# Patient Record
Sex: Female | Born: 1935
Health system: Southern US, Community
[De-identification: ages and names within clinical notes are randomized; demographics above are authoritative.]

## PROBLEM LIST (undated history)

## (undated) DIAGNOSIS — E7800 Pure hypercholesterolemia, unspecified: Secondary | ICD-10-CM

## (undated) DIAGNOSIS — G43909 Migraine, unspecified, not intractable, without status migrainosus: Secondary | ICD-10-CM

## (undated) DIAGNOSIS — N393 Stress incontinence (female) (male): Secondary | ICD-10-CM

## (undated) DIAGNOSIS — I10 Essential (primary) hypertension: Secondary | ICD-10-CM

## (undated) DIAGNOSIS — R519 Headache, unspecified: Secondary | ICD-10-CM

## (undated) DIAGNOSIS — C50919 Malignant neoplasm of unspecified site of unspecified female breast: Secondary | ICD-10-CM

## (undated) DIAGNOSIS — M1990 Unspecified osteoarthritis, unspecified site: Secondary | ICD-10-CM

## (undated) DIAGNOSIS — M5430 Sciatica, unspecified side: Secondary | ICD-10-CM

## (undated) DIAGNOSIS — Z6826 Body mass index (BMI) 26.0-26.9, adult: Secondary | ICD-10-CM

## (undated) DIAGNOSIS — J939 Pneumothorax, unspecified: Secondary | ICD-10-CM

## (undated) DIAGNOSIS — K219 Gastro-esophageal reflux disease without esophagitis: Secondary | ICD-10-CM

## (undated) DIAGNOSIS — R51 Headache: Secondary | ICD-10-CM

## (undated) DIAGNOSIS — C801 Malignant (primary) neoplasm, unspecified: Secondary | ICD-10-CM

## (undated) DIAGNOSIS — M51369 Other intervertebral disc degeneration, lumbar region without mention of lumbar back pain or lower extremity pain: Secondary | ICD-10-CM

## (undated) DIAGNOSIS — I4891 Unspecified atrial fibrillation: Secondary | ICD-10-CM

## (undated) DIAGNOSIS — M5136 Other intervertebral disc degeneration, lumbar region: Secondary | ICD-10-CM

## (undated) HISTORY — PX: CATARACT EXTRACTION: SUR2

## (undated) HISTORY — PX: BIOPSY THYROID: PRO38

## (undated) HISTORY — PX: DENTAL SURGERY: SHX609

## (undated) HISTORY — DX: Unspecified atrial fibrillation: I48.91

## (undated) HISTORY — PX: BACK SURGERY: SHX140

## (undated) HISTORY — PX: OTHER SURGICAL HISTORY: SHX169

## (undated) HISTORY — DX: Body mass index (BMI) 26.0-26.9, adult: Z68.26

## (undated) HISTORY — PX: LUNG SURGERY: SHX703

---

## 1974-04-26 HISTORY — PX: VIDEO ASSISTED THORACOSCOPY (VATS) W/TALC PLEUADESIS: SHX6168

## 1980-04-26 HISTORY — PX: ABDOMINAL HYSTERECTOMY: SHX81

## 1997-10-31 ENCOUNTER — Other Ambulatory Visit: Admission: RE | Admit: 1997-10-31 | Discharge: 1997-10-31 | Payer: Self-pay | Admitting: *Deleted

## 1998-04-26 HISTORY — PX: BACK SURGERY: SHX140

## 1999-07-17 ENCOUNTER — Other Ambulatory Visit: Admission: RE | Admit: 1999-07-17 | Discharge: 1999-07-17 | Payer: Self-pay

## 2005-04-26 HISTORY — PX: EYE SURGERY: SHX253

## 2010-04-26 DIAGNOSIS — C801 Malignant (primary) neoplasm, unspecified: Secondary | ICD-10-CM

## 2010-04-26 HISTORY — PX: BREAST SURGERY: SHX581

## 2010-04-26 HISTORY — DX: Malignant (primary) neoplasm, unspecified: C80.1

## 2012-02-07 ENCOUNTER — Emergency Department (HOSPITAL_BASED_OUTPATIENT_CLINIC_OR_DEPARTMENT_OTHER)
Admission: EM | Admit: 2012-02-07 | Discharge: 2012-02-07 | Disposition: A | Discharge status: Home / Self Care | Payer: Medicare Other | Attending: Emergency Medicine | Admitting: Emergency Medicine

## 2012-02-07 ENCOUNTER — Encounter (HOSPITAL_BASED_OUTPATIENT_CLINIC_OR_DEPARTMENT_OTHER): Payer: Self-pay | Admitting: *Deleted

## 2012-02-07 DIAGNOSIS — M5137 Other intervertebral disc degeneration, lumbosacral region: Secondary | ICD-10-CM | POA: Insufficient documentation

## 2012-02-07 DIAGNOSIS — G44209 Tension-type headache, unspecified, not intractable: Secondary | ICD-10-CM | POA: Insufficient documentation

## 2012-02-07 DIAGNOSIS — M542 Cervicalgia: Secondary | ICD-10-CM | POA: Insufficient documentation

## 2012-02-07 DIAGNOSIS — M51379 Other intervertebral disc degeneration, lumbosacral region without mention of lumbar back pain or lower extremity pain: Secondary | ICD-10-CM | POA: Insufficient documentation

## 2012-02-07 DIAGNOSIS — Z79899 Other long term (current) drug therapy: Secondary | ICD-10-CM | POA: Insufficient documentation

## 2012-02-07 DIAGNOSIS — Z853 Personal history of malignant neoplasm of breast: Secondary | ICD-10-CM | POA: Insufficient documentation

## 2012-02-07 DIAGNOSIS — Z7982 Long term (current) use of aspirin: Secondary | ICD-10-CM | POA: Insufficient documentation

## 2012-02-07 HISTORY — DX: Malignant neoplasm of unspecified site of unspecified female breast: C50.919

## 2012-02-07 HISTORY — DX: Pure hypercholesterolemia, unspecified: E78.00

## 2012-02-07 HISTORY — DX: Other intervertebral disc degeneration, lumbar region: M51.36

## 2012-02-07 HISTORY — DX: Migraine, unspecified, not intractable, without status migrainosus: G43.909

## 2012-02-07 HISTORY — DX: Other intervertebral disc degeneration, lumbar region without mention of lumbar back pain or lower extremity pain: M51.369

## 2012-02-07 HISTORY — DX: Pneumothorax, unspecified: J93.9

## 2012-02-07 HISTORY — DX: Sciatica, unspecified side: M54.30

## 2012-02-07 MED ORDER — SODIUM CHLORIDE 0.9 % IV BOLUS (SEPSIS)
1000.0000 mL | Freq: Once | INTRAVENOUS | Status: AC
  Administered 2012-02-07: 1000 mL via INTRAVENOUS

## 2012-02-07 MED ORDER — DIPHENHYDRAMINE HCL 50 MG/ML IJ SOLN
25.0000 mg | INTRAMUSCULAR | Status: AC
  Administered 2012-02-07: 08:00:00 via INTRAVENOUS
  Filled 2012-02-07: qty 1

## 2012-02-07 MED ORDER — HYDROMORPHONE HCL PF 1 MG/ML IJ SOLN: 1.0000 mg | Freq: Once | INTRAMUSCULAR | Status: DC

## 2012-02-07 MED ORDER — HYDROMORPHONE HCL PF 1 MG/ML IJ SOLN
1.0000 mg | Freq: Once | INTRAMUSCULAR | Status: AC
  Administered 2012-02-07: 1 mg via INTRAVENOUS
  Filled 2012-02-07: qty 1

## 2012-02-07 MED ORDER — METOCLOPRAMIDE HCL 5 MG/ML IJ SOLN
10.0000 mg | Freq: Once | INTRAMUSCULAR | Status: AC
  Administered 2012-02-07: 10 mg via INTRAVENOUS
  Filled 2012-02-07: qty 2

## 2012-02-07 MED ORDER — OXYCODONE-ACETAMINOPHEN 5-325 MG PO TABS: 1.0000 | ORAL_TABLET | ORAL | Status: DC | PRN

## 2012-02-07 MED ORDER — KETOROLAC TROMETHAMINE 30 MG/ML IJ SOLN
30.0000 mg | Freq: Once | INTRAMUSCULAR | Status: AC
  Administered 2012-02-07: 30 mg via INTRAVENOUS
  Filled 2012-02-07: qty 1

## 2012-02-07 NOTE — ED Notes (Signed)
IV BAG HAS GONE DOWN-PER DAUGHTER

## 2012-02-07 NOTE — ED Notes (Signed)
Patient states she developed a migraine last night at 2200.  Unsure if she took any of her medications or not.  Hx of same.

## 2012-02-07 NOTE — ED Provider Notes (Signed)
History     CSN: [id number]  Arrival date & time 02/07/12  0719   First MD Initiated Contact with Patient 02/07/12 [phone]      Chief Complaint  Patient presents with  . Migraine    (Consider location/radiation/quality/duration/timing/severity/associated sxs/prior treatment) Patient is a 76 y.o. female presenting with migraines. The history is provided by the patient.  Migraine  She had onset at about 10 PM last night of a severe headache in the neck and lower occipital area. Headache is throbbing and pounding and she rates the pain at 10/10. It is slightly worse with light and noise. Nothing makes it any better. There is no associated blurring of vision and only minimal nausea. She has not vomited. She denies weakness, numbness, tingling. She has a history of migraines, but this is different from her typical migraines and that her migraines are usually located in the frontal region. She had a similar headache 2 months ago and was seen at [facility] where CT scan was negative. She got some kind of headache cocktail which did not give her relief and she ended up getting Dilaudid and Zofran which didn't give relief.  Past Medical History  Diagnosis Date  . Elevated cholesterol   . Pneumothorax     congential  defect x 3 surgery corrected in 1976  . Migraine   . Breast cancer   . DDD (degenerative disc disease), lumbar   . Sciatic pain     Past Surgical History  Procedure Date  . Mastectomy     right  . Lung surgery     1976  . Back surgery   . Cataract extraction     bilateral  . Dental surgery   . Plantar fascitis   . Biopsy thyroid     No family history on file.  History  Substance Use Topics  . Smoking status: Never Smoker   . Smokeless tobacco: Not on file  . Alcohol Use: No    OB History    Grav Para Term Preterm Abortions TAB SAB Ect Mult Living                  Review of Systems  All other systems reviewed and are  negative.    Allergies  Morphine and related  Home Medications   Current Outpatient Rx  Name Route Sig Dispense Refill  . VITAMIN C 100 MG PO TABS Oral Take 100 mg by mouth daily.    Marland Kitchen VITAMIN C 1000 MG PO TABS Oral Take 1,000 mg by mouth daily.    . ASPIRIN 81 MG PO TABS Oral Take 81 mg by mouth daily.    Marland Kitchen CINNAMON 500 MG PO CAPS Oral Take 500 mg by mouth daily.    Marland Kitchen CLONAZEPAM 0.5 MG PO TABS Oral Take 0.5 mg by mouth 2 (two) times daily as needed.    Marland Kitchen DHEA 25 MG PO CAPS Oral Take by mouth.    . ETODOLAC 400 MG PO TABS Oral Take 400 mg by mouth 2 (two) times daily.    . IBUPROFEN 600 MG PO TABS Oral Take 600 mg by mouth every 6 (six) hours as needed.    Marland Kitchen LETROZOLE 2.5 MG PO TABS Oral Take 2.5 mg by mouth daily.    Marland Kitchen MAGNESIUM GLUCONATE 500 MG PO TABS Oral Take 250 mg by mouth 2 (two) times daily.    . MELOXICAM 15 MG PO TABS Oral Take 15 mg by mouth daily.    Marland Kitchen  NORTRIPTYLINE HCL 10 MG PO CAPS Oral Take 10 mg by mouth at bedtime.    Marland Kitchen RIZATRIPTAN BENZOATE 10 MG PO TABS Oral Take 10 mg by mouth as needed. May repeat in 2 hours if needed    . VENLAFAXINE HCL 100 MG PO TABS Oral Take 100 mg by mouth 2 (two) times daily.    Marland Kitchen VITAMIN B-12 500 MCG PO TABS Oral Take 500 mcg by mouth daily.    Marland Kitchen ZINC GLUCONATE 50 MG PO TABS Oral Take 50 mg by mouth daily.      BP 97/56  Pulse 113  Temp 97.4 F (36.3 C)  Resp 20  Ht 5\' 8"  (1.727 m)  Wt 217 lb (98.431 kg)  BMI 32.99 kg/m2  SpO2 100%  Physical Exam  Nursing note and vitals reviewed.  76 year old female who appears uncomfortable and in pain. Vital signs are significant for tachycardia with heart rate of 113. Oxygen saturation is 100%, which is normal. Head is normocephalic and atraumatic. PERRLA, EOMI. Oropharynx is clear. Fundi show no hemorrhage, exudate, or papilledema. Neck is supple without adenopathy or JVD. There is moderate spasm of the paracervical muscles with tenderness of the paracervical muscles. Back is nontender  and there is no CVA tenderness. Lungs are clear without rales, wheezes, or rhonchi. Chest is nontender. Heart has regular rate and rhythm without murmur. Abdomen is soft, flat, nontender without masses or hepatosplenomegaly and peristalsis is normoactive. Extremities have no cyanosis or edema, full range of motion is present. Skin is warm and dry without rash. Neurologic: Mental status is normal, cranial nerves are intact, there are no motor or sensory deficits.  ED Course  Procedures (including critical care time)   1. Muscle contraction headache       MDM  Headache which seems most compatible with muscle contraction headache. She will be given a  Headache cocktail and reassessed. Prior ED records show no visits for headaches.  1478: She got moderate relief with metoclopramide, diphenhydramine, ketorolac. She's given an injection of hydromorphone.  0945: She feels much better. Pain is down to 3/10 and she wishes to go home. She is sent home with prescription for Percocet for pain, and referred to neurology for further workup.    Dione Booze, MD 02/07/12 [phone]

## 2013-04-02 ENCOUNTER — Encounter (INDEPENDENT_AMBULATORY_CARE_PROVIDER_SITE_OTHER): Payer: Medicare Other | Admitting: Ophthalmology

## 2013-04-02 DIAGNOSIS — H3530 Unspecified macular degeneration: Secondary | ICD-10-CM

## 2013-04-02 DIAGNOSIS — H43819 Vitreous degeneration, unspecified eye: Secondary | ICD-10-CM

## 2013-05-11 ENCOUNTER — Encounter (INDEPENDENT_AMBULATORY_CARE_PROVIDER_SITE_OTHER): Payer: Medicare Other | Admitting: Ophthalmology

## 2013-05-11 DIAGNOSIS — H3530 Unspecified macular degeneration: Secondary | ICD-10-CM

## 2013-05-11 DIAGNOSIS — H43819 Vitreous degeneration, unspecified eye: Secondary | ICD-10-CM

## 2013-05-14 ENCOUNTER — Encounter (INDEPENDENT_AMBULATORY_CARE_PROVIDER_SITE_OTHER): Payer: Medicare Other | Admitting: Ophthalmology

## 2013-12-08 DIAGNOSIS — N951 Menopausal and female climacteric states: Secondary | ICD-10-CM

## 2013-12-08 DIAGNOSIS — M810 Age-related osteoporosis without current pathological fracture: Secondary | ICD-10-CM | POA: Insufficient documentation

## 2013-12-08 DIAGNOSIS — R110 Nausea: Secondary | ICD-10-CM

## 2013-12-08 DIAGNOSIS — N393 Stress incontinence (female) (male): Secondary | ICD-10-CM

## 2013-12-08 DIAGNOSIS — M25559 Pain in unspecified hip: Secondary | ICD-10-CM

## 2013-12-08 DIAGNOSIS — M25539 Pain in unspecified wrist: Secondary | ICD-10-CM

## 2013-12-08 DIAGNOSIS — M7989 Other specified soft tissue disorders: Secondary | ICD-10-CM | POA: Insufficient documentation

## 2013-12-08 DIAGNOSIS — N63 Unspecified lump in unspecified breast: Secondary | ICD-10-CM

## 2013-12-08 DIAGNOSIS — M719 Bursopathy, unspecified: Secondary | ICD-10-CM | POA: Insufficient documentation

## 2013-12-08 DIAGNOSIS — R7301 Impaired fasting glucose: Secondary | ICD-10-CM

## 2013-12-08 DIAGNOSIS — R030 Elevated blood-pressure reading, without diagnosis of hypertension: Secondary | ICD-10-CM

## 2013-12-08 DIAGNOSIS — G43909 Migraine, unspecified, not intractable, without status migrainosus: Secondary | ICD-10-CM

## 2013-12-08 DIAGNOSIS — R42 Dizziness and giddiness: Secondary | ICD-10-CM

## 2013-12-08 DIAGNOSIS — M545 Low back pain, unspecified: Secondary | ICD-10-CM

## 2013-12-08 DIAGNOSIS — R58 Hemorrhage, not elsewhere classified: Secondary | ICD-10-CM

## 2013-12-08 DIAGNOSIS — M7730 Calcaneal spur, unspecified foot: Secondary | ICD-10-CM | POA: Insufficient documentation

## 2013-12-08 DIAGNOSIS — E669 Obesity, unspecified: Secondary | ICD-10-CM

## 2013-12-08 DIAGNOSIS — G479 Sleep disorder, unspecified: Secondary | ICD-10-CM

## 2013-12-08 DIAGNOSIS — C50919 Malignant neoplasm of unspecified site of unspecified female breast: Secondary | ICD-10-CM

## 2013-12-08 DIAGNOSIS — K219 Gastro-esophageal reflux disease without esophagitis: Secondary | ICD-10-CM

## 2013-12-08 DIAGNOSIS — E042 Nontoxic multinodular goiter: Secondary | ICD-10-CM

## 2013-12-08 DIAGNOSIS — E785 Hyperlipidemia, unspecified: Secondary | ICD-10-CM

## 2013-12-08 DIAGNOSIS — F32A Depression, unspecified: Secondary | ICD-10-CM

## 2013-12-08 DIAGNOSIS — E559 Vitamin D deficiency, unspecified: Secondary | ICD-10-CM

## 2013-12-08 DIAGNOSIS — J309 Allergic rhinitis, unspecified: Secondary | ICD-10-CM

## 2013-12-08 DIAGNOSIS — S40019A Contusion of unspecified shoulder, initial encounter: Secondary | ICD-10-CM | POA: Insufficient documentation

## 2013-12-08 DIAGNOSIS — N3642 Intrinsic sphincter deficiency (ISD): Secondary | ICD-10-CM

## 2013-12-08 DIAGNOSIS — I10 Essential (primary) hypertension: Secondary | ICD-10-CM

## 2013-12-08 DIAGNOSIS — R519 Headache, unspecified: Secondary | ICD-10-CM | POA: Insufficient documentation

## 2013-12-08 DIAGNOSIS — S52509A Unspecified fracture of the lower end of unspecified radius, initial encounter for closed fracture: Secondary | ICD-10-CM

## 2013-12-08 DIAGNOSIS — M1990 Unspecified osteoarthritis, unspecified site: Secondary | ICD-10-CM

## 2013-12-08 DIAGNOSIS — K589 Irritable bowel syndrome without diarrhea: Secondary | ICD-10-CM | POA: Insufficient documentation

## 2013-12-08 DIAGNOSIS — M5414 Radiculopathy, thoracic region: Secondary | ICD-10-CM

## 2013-12-08 DIAGNOSIS — H9190 Unspecified hearing loss, unspecified ear: Secondary | ICD-10-CM

## 2013-12-08 HISTORY — DX: Unspecified osteoarthritis, unspecified site: M19.90

## 2013-12-08 HISTORY — DX: Depression, unspecified: F32.A

## 2013-12-08 HISTORY — DX: Sleep disorder, unspecified: G47.9

## 2013-12-08 HISTORY — DX: Unspecified fracture of the lower end of unspecified radius, initial encounter for closed fracture: S52.509A

## 2013-12-08 HISTORY — DX: Essential (primary) hypertension: I10

## 2013-12-08 HISTORY — DX: Irritable bowel syndrome, unspecified: K58.9

## 2013-12-08 HISTORY — DX: Hemorrhage, not elsewhere classified: R58

## 2013-12-08 HISTORY — DX: Other specified soft tissue disorders: M79.89

## 2013-12-08 HISTORY — DX: Unspecified lump in unspecified breast: N63.0

## 2013-12-08 HISTORY — DX: Calcaneal spur, unspecified foot: M77.30

## 2013-12-08 HISTORY — DX: Nontoxic multinodular goiter: E04.2

## 2013-12-08 HISTORY — DX: Nausea: R11.0

## 2013-12-08 HISTORY — DX: Age-related osteoporosis without current pathological fracture: M81.0

## 2013-12-08 HISTORY — DX: Allergic rhinitis, unspecified: J30.9

## 2013-12-08 HISTORY — DX: Vitamin D deficiency, unspecified: E55.9

## 2013-12-08 HISTORY — DX: Hypercalcemia: E83.52

## 2013-12-08 HISTORY — DX: Pain in unspecified hip: M25.559

## 2013-12-08 HISTORY — DX: Stress incontinence (female) (male): N39.3

## 2013-12-08 HISTORY — DX: Low back pain, unspecified: M54.50

## 2013-12-08 HISTORY — DX: Unspecified hearing loss, unspecified ear: H91.90

## 2013-12-08 HISTORY — DX: Radiculopathy, thoracic region: M54.14

## 2013-12-08 HISTORY — DX: Impaired fasting glucose: R73.01

## 2013-12-08 HISTORY — DX: Hyperlipidemia, unspecified: E78.5

## 2013-12-08 HISTORY — DX: Elevated blood-pressure reading, without diagnosis of hypertension: R03.0

## 2013-12-08 HISTORY — DX: Obesity, unspecified: E66.9

## 2013-12-08 HISTORY — DX: Headache, unspecified: R51.9

## 2013-12-08 HISTORY — DX: Malignant neoplasm of unspecified site of unspecified female breast: C50.919

## 2013-12-08 HISTORY — DX: Intrinsic sphincter deficiency (ISD): N36.42

## 2013-12-08 HISTORY — DX: Contusion of unspecified shoulder, initial encounter: S40.019A

## 2013-12-08 HISTORY — DX: Dizziness and giddiness: R42

## 2013-12-08 HISTORY — DX: Gastro-esophageal reflux disease without esophagitis: K21.9

## 2013-12-08 HISTORY — DX: Migraine, unspecified, not intractable, without status migrainosus: G43.909

## 2013-12-08 HISTORY — DX: Bursopathy, unspecified: M71.9

## 2013-12-08 HISTORY — DX: Menopausal and female climacteric states: N95.1

## 2013-12-08 HISTORY — DX: Pain in unspecified wrist: M25.539

## 2015-07-28 ENCOUNTER — Encounter (HOSPITAL_COMMUNITY)
Admission: RE | Admit: 2015-07-28 | Discharge: 2015-07-28 | Disposition: A | Discharge status: Home / Self Care | Payer: Medicare Other | Source: Ambulatory Visit | Attending: Orthopedic Surgery | Admitting: Orthopedic Surgery

## 2015-07-28 ENCOUNTER — Encounter (HOSPITAL_COMMUNITY): Payer: Self-pay

## 2015-07-28 ENCOUNTER — Other Ambulatory Visit: Payer: Self-pay | Admitting: Orthopedic Surgery

## 2015-07-28 ENCOUNTER — Other Ambulatory Visit (HOSPITAL_COMMUNITY): Payer: Self-pay | Admitting: *Deleted

## 2015-07-28 DIAGNOSIS — Z01812 Encounter for preprocedural laboratory examination: Secondary | ICD-10-CM

## 2015-07-28 DIAGNOSIS — Z01818 Encounter for other preprocedural examination: Secondary | ICD-10-CM | POA: Diagnosis present

## 2015-07-28 DIAGNOSIS — M1712 Unilateral primary osteoarthritis, left knee: Secondary | ICD-10-CM

## 2015-07-28 DIAGNOSIS — Z0183 Encounter for blood typing: Secondary | ICD-10-CM | POA: Diagnosis not present

## 2015-07-28 HISTORY — DX: Unspecified osteoarthritis, unspecified site: M19.90

## 2015-07-28 HISTORY — DX: Headache: R51

## 2015-07-28 HISTORY — DX: Headache, unspecified: R51.9

## 2015-07-28 HISTORY — DX: Malignant (primary) neoplasm, unspecified: C80.1

## 2015-07-28 LAB — CBC
HCT: 35.4 % — ABNORMAL LOW (ref 36.0–46.0)
Hemoglobin: 11.4 g/dL — ABNORMAL LOW (ref 12.0–15.0)
MCH: 30.6 pg (ref 26.0–34.0)
MCHC: 32.2 g/dL (ref 30.0–36.0)
MCV: 94.9 fL (ref 78.0–100.0)
PLATELETS: 121 10*3/uL — AB (ref 150–400)
RBC: 3.73 MIL/uL — ABNORMAL LOW (ref 3.87–5.11)
RDW: 14.5 % (ref 11.5–15.5)
WBC: 4.2 10*3/uL (ref 4.0–10.5)

## 2015-07-28 LAB — BASIC METABOLIC PANEL
Anion gap: 10 (ref 5–15)
BUN: 23 mg/dL — ABNORMAL HIGH (ref 6–20)
CHLORIDE: 109 mmol/L (ref 101–111)
CO2: 24 mmol/L (ref 22–32)
CREATININE: 1.28 mg/dL — AB (ref 0.44–1.00)
Calcium: 9.9 mg/dL (ref 8.9–10.3)
GFR calc Af Amer: 46 mL/min — ABNORMAL LOW (ref >60)
GFR calc non Af Amer: 39 mL/min — ABNORMAL LOW (ref >60)
Glucose, Bld: 104 mg/dL — ABNORMAL HIGH (ref 65–99)
Potassium: 3.7 mmol/L (ref 3.5–5.1)
SODIUM: 143 mmol/L (ref 135–145)

## 2015-07-28 LAB — SURGICAL PCR SCREEN
MRSA, PCR: NEGATIVE
Staphylococcus aureus: NEGATIVE

## 2015-07-28 NOTE — Pre-Procedure Instructions (Addendum)
Whitney Campbell  07/28/2015      [id number] - [place], [place] - [address]  [address] Phone: [phone] Fax: [phone]    Your procedure is scheduled on Monday, August 04, 2015 at 7:30 AM.   Report to [facility] Entrance "A" Admitting Office at 5:30 AM.   Call this number if you have problems the morning of surgery: [phone]   Any questions prior to day of surgery, please call [phone] between 8 & 4 PM.   Remember:  Do not eat food or drink liquids after midnight Sunday, 08/03/15.  Take these medicines the morning of surgery with A SIP OF WATER: Tamoxifen, Tramadol - if needed   STOP all herbel meds, nsaids (aleve,naproxen,advil,ibuprofen) starting today including aspirin, all vitamins,supplements   Do not wear jewelry, make-up or nail polish.  Do not wear lotions, powders, or perfumes.  You may wear deodorant.  Do not shave 48 hours prior to surgery.    Do not bring valuables to the hospital.  [facility] is not responsible for any belongings or valuables.  Contacts, dentures or bridgework may not be worn into surgery.  Leave your suitcase in the car.  After surgery it may be brought to your room.  For patients admitted to the hospital, discharge time will be determined by your treatment team.  Special instructions:  [place] - Preparing for Surgery  Before surgery, you can play an important role.  Because skin is not sterile, your skin needs to be as free of germs as possible.  You can reduce the number of germs on you skin by washing with CHG (chlorahexidine gluconate) soap before surgery.  CHG is an antiseptic cleaner which kills germs and bonds with the skin to continue killing germs even after washing.  Please DO NOT use if you have an allergy to CHG or antibacterial soaps.  If your skin becomes reddened/irritated stop using the CHG and inform your nurse when you arrive at Short  Stay.  Do not shave (including legs and underarms) for at least 48 hours prior to the first CHG shower.  You may shave your face.  Please follow these instructions carefully:   1.  Shower with CHG Soap the night before surgery and the                                morning of Surgery.  2.  If you choose to wash your hair, wash your hair first as usual with your       normal shampoo.  3.  After you shampoo, rinse your hair and body thoroughly to remove the                      Shampoo.  4.  Use CHG as you would any other liquid soap.  You can apply chg directly       to the skin and wash gently with scrungie or a clean washcloth.  5.  Apply the CHG Soap to your body ONLY FROM THE NECK DOWN.        Do not use on open wounds or open sores.  Avoid contact with your eyes, ears, mouth and genitals (private parts).  Wash genitals (private parts) with your normal soap.  6.  Wash thoroughly, paying special attention to the area where your surgery  will be performed.  7.  Thoroughly rinse your body with warm water from the neck down.  8.  DO NOT shower/wash with your normal soap after using and rinsing off       the CHG Soap.  9.  Pat yourself dry with a clean towel.            10.  Wear clean pajamas.            11.  Place clean sheets on your bed the night of your first shower and do not        sleep with pets.  Day of Surgery  Do not apply any lotions the morning of surgery.  Please wear clean clothes to the hospital.   Please read over the following fact sheets that you were given. Pain Booklet, Coughing and Deep Breathing, MRSA Information and Surgical Site Infection Prevention

## 2015-07-29 ENCOUNTER — Encounter (HOSPITAL_BASED_OUTPATIENT_CLINIC_OR_DEPARTMENT_OTHER): Payer: Self-pay | Admitting: *Deleted

## 2015-07-30 ENCOUNTER — Ambulatory Visit (HOSPITAL_COMMUNITY)
Admission: RE | Admit: 2015-07-30 | Discharge: 2015-07-30 | Disposition: A | Discharge status: Home / Self Care | Payer: Medicare Other | Source: Ambulatory Visit | Attending: Orthopedic Surgery | Admitting: Orthopedic Surgery

## 2015-07-30 ENCOUNTER — Encounter (HOSPITAL_COMMUNITY)
Admission: RE | Admit: 2015-07-30 | Discharge: 2015-07-30 | Disposition: A | Discharge status: Home / Self Care | Payer: Medicare Other | Source: Ambulatory Visit | Attending: Orthopedic Surgery | Admitting: Orthopedic Surgery

## 2015-07-30 DIAGNOSIS — M1712 Unilateral primary osteoarthritis, left knee: Secondary | ICD-10-CM | POA: Insufficient documentation

## 2015-07-30 DIAGNOSIS — Z01818 Encounter for other preprocedural examination: Secondary | ICD-10-CM | POA: Diagnosis not present

## 2015-07-30 DIAGNOSIS — Z01812 Encounter for preprocedural laboratory examination: Secondary | ICD-10-CM | POA: Insufficient documentation

## 2015-07-30 DIAGNOSIS — Z0183 Encounter for blood typing: Secondary | ICD-10-CM | POA: Insufficient documentation

## 2015-07-30 LAB — TYPE AND SCREEN
ABO/RH(D): A POS
ANTIBODY SCREEN: NEGATIVE

## 2015-07-30 LAB — HEPATIC FUNCTION PANEL
ALBUMIN: 3.6 g/dL (ref 3.5–5.0)
ALK PHOS: 47 U/L (ref 38–126)
ALT: 15 U/L (ref 14–54)
AST: 20 U/L (ref 15–41)
Bilirubin, Direct: 0.1 mg/dL (ref 0.1–0.5)
Indirect Bilirubin: 0.2 mg/dL — ABNORMAL LOW (ref 0.3–0.9)
TOTAL PROTEIN: 6.7 g/dL (ref 6.5–8.1)
Total Bilirubin: 0.3 mg/dL (ref 0.3–1.2)

## 2015-07-30 LAB — PROTIME-INR
INR: 1.08 (ref 0.00–1.49)
Prothrombin Time: 14.2 seconds (ref 11.6–15.2)

## 2015-07-30 LAB — URINALYSIS, ROUTINE W REFLEX MICROSCOPIC
BILIRUBIN URINE: NEGATIVE
Glucose, UA: NEGATIVE mg/dL
Hgb urine dipstick: NEGATIVE
KETONES UR: NEGATIVE mg/dL
Leukocytes, UA: NEGATIVE
NITRITE: NEGATIVE
PH: 5.5 (ref 5.0–8.0)
Protein, ur: NEGATIVE mg/dL
Specific Gravity, Urine: 1.016 (ref 1.005–1.030)

## 2015-07-30 LAB — DIFFERENTIAL
Basophils Absolute: 0 10*3/uL (ref 0.0–0.1)
Basophils Relative: 1 %
EOS PCT: 3 %
Eosinophils Absolute: 0.2 10*3/uL (ref 0.0–0.7)
LYMPHS ABS: 1.4 10*3/uL (ref 0.7–4.0)
LYMPHS PCT: 31 %
MONO ABS: 0.4 10*3/uL (ref 0.1–1.0)
MONOS PCT: 10 %
Neutro Abs: 2.5 10*3/uL (ref 1.7–7.7)
Neutrophils Relative %: 55 %

## 2015-07-30 LAB — ABO/RH: ABO/RH(D): A POS

## 2015-07-30 LAB — APTT: aPTT: 33 seconds (ref 24–37)

## 2015-07-31 LAB — URINE CULTURE

## 2015-08-01 MED ORDER — SODIUM CHLORIDE 0.9 % IV SOLN: INTRAVENOUS | Status: DC

## 2015-08-01 MED ORDER — ACETAMINOPHEN 500 MG PO TABS
1000.0000 mg | ORAL_TABLET | Freq: Once | ORAL | Status: AC
  Administered 2015-08-04: 1000 mg via ORAL

## 2015-08-01 MED ORDER — CEFAZOLIN SODIUM-DEXTROSE 2-4 GM/100ML-% IV SOLN
2.0000 g | INTRAVENOUS | Status: AC
  Administered 2015-08-04: 2 g via INTRAVENOUS

## 2015-08-01 MED ORDER — TRANEXAMIC ACID 1000 MG/10ML IV SOLN
1000.0000 mg | INTRAVENOUS | Status: AC
  Administered 2015-08-04: 1000 mg via INTRAVENOUS
  Filled 2015-08-01: qty 10

## 2015-08-04 ENCOUNTER — Encounter (HOSPITAL_COMMUNITY)
Admission: RE | Disposition: A | Discharge status: Home Health | Payer: Self-pay | Source: Ambulatory Visit | Attending: Orthopedic Surgery

## 2015-08-04 ENCOUNTER — Inpatient Hospital Stay (HOSPITAL_COMMUNITY)
Admission: RE | Admit: 2015-08-04 | Discharge: 2015-08-05 | DRG: 470 | Disposition: A | Discharge status: Home Health | Payer: Medicare Other | Source: Ambulatory Visit | Attending: Orthopedic Surgery | Admitting: Orthopedic Surgery

## 2015-08-04 ENCOUNTER — Encounter (HOSPITAL_COMMUNITY): Payer: Self-pay | Admitting: *Deleted

## 2015-08-04 ENCOUNTER — Inpatient Hospital Stay (HOSPITAL_COMMUNITY): Payer: Medicare Other | Admitting: Anesthesiology

## 2015-08-04 DIAGNOSIS — Z96659 Presence of unspecified artificial knee joint: Secondary | ICD-10-CM

## 2015-08-04 DIAGNOSIS — Z853 Personal history of malignant neoplasm of breast: Secondary | ICD-10-CM

## 2015-08-04 DIAGNOSIS — Z7982 Long term (current) use of aspirin: Secondary | ICD-10-CM | POA: Diagnosis not present

## 2015-08-04 DIAGNOSIS — Z79899 Other long term (current) drug therapy: Secondary | ICD-10-CM

## 2015-08-04 DIAGNOSIS — M1712 Unilateral primary osteoarthritis, left knee: Principal | ICD-10-CM | POA: Diagnosis present

## 2015-08-04 DIAGNOSIS — Z885 Allergy status to narcotic agent status: Secondary | ICD-10-CM

## 2015-08-04 DIAGNOSIS — M25562 Pain in left knee: Secondary | ICD-10-CM | POA: Diagnosis present

## 2015-08-04 DIAGNOSIS — E7800 Pure hypercholesterolemia, unspecified: Secondary | ICD-10-CM | POA: Diagnosis present

## 2015-08-04 DIAGNOSIS — D62 Acute posthemorrhagic anemia: Secondary | ICD-10-CM | POA: Diagnosis not present

## 2015-08-04 HISTORY — PX: TOTAL KNEE ARTHROPLASTY: SHX125

## 2015-08-04 LAB — CBC
HCT: 32.3 % — ABNORMAL LOW (ref 36.0–46.0)
HEMOGLOBIN: 10.3 g/dL — AB (ref 12.0–15.0)
MCH: 29.8 pg (ref 26.0–34.0)
MCHC: 31.9 g/dL (ref 30.0–36.0)
MCV: 93.4 fL (ref 78.0–100.0)
PLATELETS: 117 10*3/uL — AB (ref 150–400)
RBC: 3.46 MIL/uL — AB (ref 3.87–5.11)
RDW: 14.2 % (ref 11.5–15.5)
WBC: 4.7 10*3/uL (ref 4.0–10.5)

## 2015-08-04 LAB — CREATININE, SERUM
Creatinine, Ser: 1.27 mg/dL — ABNORMAL HIGH (ref 0.44–1.00)
GFR calc Af Amer: 46 mL/min — ABNORMAL LOW (ref >60)
GFR, EST NON AFRICAN AMERICAN: 40 mL/min — AB (ref >60)

## 2015-08-04 SURGERY — ARTHROPLASTY, KNEE, TOTAL
Anesthesia: Monitor Anesthesia Care | Site: Knee | Laterality: Left

## 2015-08-04 MED ORDER — ONDANSETRON HCL 4 MG/2ML IJ SOLN: 4.0000 mg | Freq: Four times a day (QID) | INTRAMUSCULAR | Status: DC | PRN

## 2015-08-04 MED ORDER — ZOLPIDEM TARTRATE 5 MG PO TABS: 5.0000 mg | ORAL_TABLET | Freq: Every evening | ORAL | Status: DC | PRN

## 2015-08-04 MED ORDER — CEFAZOLIN SODIUM-DEXTROSE 2-4 GM/100ML-% IV SOLN
INTRAVENOUS | Status: AC
  Filled 2015-08-04: qty 100

## 2015-08-04 MED ORDER — OXYCODONE HCL 5 MG PO TABS
5.0000 mg | ORAL_TABLET | ORAL | Status: DC | PRN
  Administered 2015-08-04 – 2015-08-05 (×5): 10 mg via ORAL
  Filled 2015-08-04 (×5): qty 2

## 2015-08-04 MED ORDER — SENNOSIDES-DOCUSATE SODIUM 8.6-50 MG PO TABS: 1.0000 | ORAL_TABLET | Freq: Every evening | ORAL | Status: DC | PRN

## 2015-08-04 MED ORDER — DIPHENHYDRAMINE HCL 12.5 MG/5ML PO ELIX: 12.5000 mg | ORAL_SOLUTION | ORAL | Status: DC | PRN

## 2015-08-04 MED ORDER — BUPIVACAINE LIPOSOME 1.3 % IJ SUSP
20.0000 mL | INTRAMUSCULAR | Status: DC
  Filled 2015-08-04: qty 20

## 2015-08-04 MED ORDER — TRANEXAMIC ACID 1000 MG/10ML IV SOLN
1000.0000 mg | Freq: Once | INTRAVENOUS | Status: AC
  Administered 2015-08-04: 1000 mg via INTRAVENOUS
  Filled 2015-08-04 (×2): qty 10

## 2015-08-04 MED ORDER — ACETAMINOPHEN 650 MG RE SUPP
650.0000 mg | Freq: Four times a day (QID) | RECTAL | Status: DC | PRN
Start: 2015-08-04 — End: 2015-08-05

## 2015-08-04 MED ORDER — BUPIVACAINE-EPINEPHRINE (PF) 0.25% -1:200000 IJ SOLN
INTRAMUSCULAR | Status: DC | PRN
  Administered 2015-08-04: 30 mL

## 2015-08-04 MED ORDER — ACETAMINOPHEN 500 MG PO TABS
ORAL_TABLET | ORAL | Status: AC
  Administered 2015-08-04: 1000 mg
  Filled 2015-08-04: qty 2

## 2015-08-04 MED ORDER — FLEET ENEMA 7-19 GM/118ML RE ENEM: 1.0000 | ENEMA | Freq: Once | RECTAL | Status: DC | PRN

## 2015-08-04 MED ORDER — CHLORHEXIDINE GLUCONATE 4 % EX LIQD: 60.0000 mL | Freq: Once | CUTANEOUS | Status: DC

## 2015-08-04 MED ORDER — FENTANYL CITRATE (PF) 100 MCG/2ML IJ SOLN
INTRAMUSCULAR | Status: DC | PRN
  Administered 2015-08-04 (×4): 25 ug via INTRAVENOUS

## 2015-08-04 MED ORDER — BUPIVACAINE LIPOSOME 1.3 % IJ SUSP
INTRAMUSCULAR | Status: DC | PRN
  Administered 2015-08-04: 20 mL

## 2015-08-04 MED ORDER — BUPIVACAINE-EPINEPHRINE (PF) 0.5% -1:200000 IJ SOLN
INTRAMUSCULAR | Status: AC
  Filled 2015-08-04: qty 30

## 2015-08-04 MED ORDER — SODIUM CHLORIDE 0.9 % IJ SOLN
INTRAMUSCULAR | Status: AC
  Filled 2015-08-04: qty 10

## 2015-08-04 MED ORDER — SODIUM CHLORIDE 0.9 % IJ SOLN
INTRAMUSCULAR | Status: DC | PRN
  Administered 2015-08-04: 20 mL via INTRAVENOUS

## 2015-08-04 MED ORDER — NORTRIPTYLINE HCL 10 MG PO CAPS
10.0000 mg | ORAL_CAPSULE | Freq: Every day | ORAL | Status: DC
Start: 2015-08-04 — End: 2015-08-05
  Administered 2015-08-04: 10 mg via ORAL
  Filled 2015-08-04 (×2): qty 1

## 2015-08-04 MED ORDER — FESOTERODINE FUMARATE ER 4 MG PO TB24
4.0000 mg | ORAL_TABLET | Freq: Every day | ORAL | Status: DC
  Administered 2015-08-04 – 2015-08-05 (×2): 4 mg via ORAL
  Filled 2015-08-04 (×2): qty 1

## 2015-08-04 MED ORDER — OXYCODONE HCL ER 10 MG PO T12A
10.0000 mg | EXTENDED_RELEASE_TABLET | Freq: Two times a day (BID) | ORAL | Status: DC
  Administered 2015-08-04 – 2015-08-05 (×3): 10 mg via ORAL
  Filled 2015-08-04 (×3): qty 1

## 2015-08-04 MED ORDER — ROPINIROLE HCL 1 MG PO TABS
1.0000 mg | ORAL_TABLET | Freq: Every day | ORAL | Status: DC
Start: 2015-08-04 — End: 2015-08-05
  Administered 2015-08-04: 1 mg via ORAL
  Filled 2015-08-04: qty 1

## 2015-08-04 MED ORDER — ENOXAPARIN SODIUM 30 MG/0.3ML ~~LOC~~ SOLN: 30.0000 mg | Freq: Two times a day (BID) | SUBCUTANEOUS | Status: DC

## 2015-08-04 MED ORDER — CEFAZOLIN SODIUM 1-5 GM-% IV SOLN
1.0000 g | Freq: Four times a day (QID) | INTRAVENOUS | Status: AC
  Administered 2015-08-04 (×2): 1 g via INTRAVENOUS
  Filled 2015-08-04 (×2): qty 50

## 2015-08-04 MED ORDER — DOCUSATE SODIUM 100 MG PO CAPS
100.0000 mg | ORAL_CAPSULE | Freq: Two times a day (BID) | ORAL | Status: DC
  Administered 2015-08-04 – 2015-08-05 (×3): 100 mg via ORAL
  Filled 2015-08-04 (×2): qty 1

## 2015-08-04 MED ORDER — ONDANSETRON HCL 4 MG PO TABS: 4.0000 mg | ORAL_TABLET | Freq: Four times a day (QID) | ORAL | Status: DC | PRN

## 2015-08-04 MED ORDER — MENTHOL 3 MG MT LOZG: 1.0000 | LOZENGE | OROMUCOSAL | Status: DC | PRN

## 2015-08-04 MED ORDER — SUCCINYLCHOLINE CHLORIDE 20 MG/ML IJ SOLN
INTRAMUSCULAR | Status: AC
  Filled 2015-08-04: qty 1

## 2015-08-04 MED ORDER — ROCURONIUM BROMIDE 50 MG/5ML IV SOLN
INTRAVENOUS | Status: AC
  Filled 2015-08-04: qty 1

## 2015-08-04 MED ORDER — HYDROMORPHONE HCL 1 MG/ML IJ SOLN
1.0000 mg | INTRAMUSCULAR | Status: DC | PRN
  Administered 2015-08-04 – 2015-08-05 (×2): 1 mg via INTRAVENOUS
  Filled 2015-08-04 (×2): qty 1

## 2015-08-04 MED ORDER — LACTATED RINGERS IV SOLN
INTRAVENOUS | Status: DC | PRN
  Administered 2015-08-04 (×2): via INTRAVENOUS

## 2015-08-04 MED ORDER — BUPIVACAINE-EPINEPHRINE (PF) 0.25% -1:200000 IJ SOLN
INTRAMUSCULAR | Status: AC
Start: 2015-08-04 — End: 2015-08-04
  Filled 2015-08-04: qty 30

## 2015-08-04 MED ORDER — 0.9 % SODIUM CHLORIDE (POUR BTL) OPTIME
TOPICAL | Status: DC | PRN
  Administered 2015-08-04: 1000 mL

## 2015-08-04 MED ORDER — EPHEDRINE SULFATE 50 MG/ML IJ SOLN
INTRAMUSCULAR | Status: AC
  Filled 2015-08-04: qty 1

## 2015-08-04 MED ORDER — VENLAFAXINE HCL 50 MG PO TABS
100.0000 mg | ORAL_TABLET | Freq: Two times a day (BID) | ORAL | Status: DC
  Administered 2015-08-04 – 2015-08-05 (×3): 100 mg via ORAL
  Filled 2015-08-04 (×4): qty 2

## 2015-08-04 MED ORDER — METHOCARBAMOL 500 MG PO TABS
500.0000 mg | ORAL_TABLET | Freq: Four times a day (QID) | ORAL | Status: DC | PRN
  Administered 2015-08-04 – 2015-08-05 (×2): 500 mg via ORAL
  Filled 2015-08-04 (×3): qty 1

## 2015-08-04 MED ORDER — ALUM & MAG HYDROXIDE-SIMETH 200-200-20 MG/5ML PO SUSP: 30.0000 mL | ORAL | Status: DC | PRN

## 2015-08-04 MED ORDER — METOCLOPRAMIDE HCL 5 MG PO TABS
5.0000 mg | ORAL_TABLET | Freq: Three times a day (TID) | ORAL | Status: DC | PRN
Start: 2015-08-04 — End: 2015-08-05

## 2015-08-04 MED ORDER — BISACODYL 5 MG PO TBEC: 5.0000 mg | DELAYED_RELEASE_TABLET | Freq: Every day | ORAL | Status: DC | PRN

## 2015-08-04 MED ORDER — METHOCARBAMOL 1000 MG/10ML IJ SOLN
500.0000 mg | Freq: Four times a day (QID) | INTRAVENOUS | Status: DC | PRN
  Filled 2015-08-04: qty 5

## 2015-08-04 MED ORDER — PROPOFOL 500 MG/50ML IV EMUL
INTRAVENOUS | Status: DC | PRN
  Administered 2015-08-04: 30 ug/kg/min via INTRAVENOUS

## 2015-08-04 MED ORDER — PROPOFOL 10 MG/ML IV BOLUS
INTRAVENOUS | Status: DC | PRN
Start: 2015-08-04 — End: 2015-08-04
  Administered 2015-08-04 (×3): 20 mg via INTRAVENOUS

## 2015-08-04 MED ORDER — CELECOXIB 200 MG PO CAPS
200.0000 mg | ORAL_CAPSULE | Freq: Two times a day (BID) | ORAL | Status: DC
  Administered 2015-08-04 – 2015-08-05 (×3): 200 mg via ORAL
  Filled 2015-08-04 (×3): qty 1

## 2015-08-04 MED ORDER — GLYCOPYRROLATE 0.2 MG/ML IJ SOLN
INTRAMUSCULAR | Status: AC
  Filled 2015-08-04: qty 1

## 2015-08-04 MED ORDER — SODIUM CHLORIDE 0.9 % IV SOLN
INTRAVENOUS | Status: DC
  Administered 2015-08-04: 10:00:00 via INTRAVENOUS

## 2015-08-04 MED ORDER — ONDANSETRON HCL 4 MG/2ML IJ SOLN: 4.0000 mg | Freq: Once | INTRAMUSCULAR | Status: DC | PRN

## 2015-08-04 MED ORDER — SODIUM CHLORIDE 0.9 % IR SOLN
Status: DC | PRN
  Administered 2015-08-04: 1000 mL

## 2015-08-04 MED ORDER — LIDOCAINE HCL (CARDIAC) 20 MG/ML IV SOLN
INTRAVENOUS | Status: DC | PRN
  Administered 2015-08-04: 40 mg via INTRAVENOUS

## 2015-08-04 MED ORDER — MIDAZOLAM HCL 5 MG/5ML IJ SOLN
INTRAMUSCULAR | Status: DC | PRN
Start: 2015-08-04 — End: 2015-08-04
  Administered 2015-08-04: 1 mg via INTRAVENOUS

## 2015-08-04 MED ORDER — LIDOCAINE HCL (CARDIAC) 20 MG/ML IV SOLN
INTRAVENOUS | Status: AC
  Filled 2015-08-04: qty 5

## 2015-08-04 MED ORDER — METOCLOPRAMIDE HCL 5 MG/ML IJ SOLN: 5.0000 mg | Freq: Three times a day (TID) | INTRAMUSCULAR | Status: DC | PRN

## 2015-08-04 MED ORDER — PHENOL 1.4 % MT LIQD: 1.0000 | OROMUCOSAL | Status: DC | PRN

## 2015-08-04 MED ORDER — ENOXAPARIN SODIUM 30 MG/0.3ML ~~LOC~~ SOLN
30.0000 mg | Freq: Two times a day (BID) | SUBCUTANEOUS | Status: DC
  Administered 2015-08-04: 30 mg via SUBCUTANEOUS
  Filled 2015-08-04: qty 0.3

## 2015-08-04 MED ORDER — FENTANYL CITRATE (PF) 100 MCG/2ML IJ SOLN: 25.0000 ug | INTRAMUSCULAR | Status: DC | PRN

## 2015-08-04 MED ORDER — ONDANSETRON HCL 4 MG/2ML IJ SOLN
INTRAMUSCULAR | Status: AC
  Filled 2015-08-04: qty 2

## 2015-08-04 MED ORDER — ACETAMINOPHEN 325 MG PO TABS
650.0000 mg | ORAL_TABLET | Freq: Four times a day (QID) | ORAL | Status: DC | PRN
Start: 2015-08-04 — End: 2015-08-05

## 2015-08-04 SURGICAL SUPPLY — 61 items
BANDAGE ESMARK 6X9 LF (GAUZE/BANDAGES/DRESSINGS) ×1 IMPLANT
BLADE SAGITTAL 13X1.27X60 (BLADE) ×2 IMPLANT
BLADE SAGITTAL 13X1.27X60MM (BLADE) ×1
BLADE SAW SGTL 83.5X18.5 (BLADE) ×3 IMPLANT
BLADE SURG 10 STRL SS (BLADE) ×3 IMPLANT
BNDG CMPR 9X6 STRL LF SNTH (GAUZE/BANDAGES/DRESSINGS) ×1
BNDG ESMARK 6X9 LF (GAUZE/BANDAGES/DRESSINGS) ×3
BOWL SMART MIX CTS (DISPOSABLE) ×3 IMPLANT
CAPT KNEE TOTAL 3 ×3 IMPLANT
CEMENT BONE SIMPLEX SPEEDSET (Cement) ×6 IMPLANT
COVER SURGICAL LIGHT HANDLE (MISCELLANEOUS) ×3 IMPLANT
CUFF TOURNIQUET SINGLE 34IN LL (TOURNIQUET CUFF) ×3 IMPLANT
DRAPE EXTREMITY T 121X128X90 (DRAPE) ×3 IMPLANT
DRAPE INCISE IOBAN 66X45 STRL (DRAPES) ×6 IMPLANT
DRAPE PROXIMA HALF (DRAPES) IMPLANT
DRAPE U-SHAPE 47X51 STRL (DRAPES) ×3 IMPLANT
DRSG ADAPTIC 3X8 NADH LF (GAUZE/BANDAGES/DRESSINGS) ×3 IMPLANT
DRSG PAD ABDOMINAL 8X10 ST (GAUZE/BANDAGES/DRESSINGS) ×3 IMPLANT
DURAPREP 26ML APPLICATOR (WOUND CARE) ×6 IMPLANT
ELECT REM PT RETURN 9FT ADLT (ELECTROSURGICAL) ×3
ELECTRODE REM PT RTRN 9FT ADLT (ELECTROSURGICAL) ×1 IMPLANT
GAUZE SPONGE 4X4 12PLY STRL (GAUZE/BANDAGES/DRESSINGS) ×3 IMPLANT
GLOVE BIOGEL M 7.0 STRL (GLOVE) IMPLANT
GLOVE BIOGEL PI IND STRL 7.5 (GLOVE) IMPLANT
GLOVE BIOGEL PI IND STRL 8.5 (GLOVE) ×5 IMPLANT
GLOVE BIOGEL PI INDICATOR 7.5 (GLOVE)
GLOVE BIOGEL PI INDICATOR 8.5 (GLOVE) ×10
GLOVE SURG ORTHO 8.0 STRL STRW (GLOVE) ×18 IMPLANT
GOWN STRL REUS W/ TWL LRG LVL3 (GOWN DISPOSABLE) ×1 IMPLANT
GOWN STRL REUS W/ TWL XL LVL3 (GOWN DISPOSABLE) ×2 IMPLANT
GOWN STRL REUS W/TWL 2XL LVL3 (GOWN DISPOSABLE) ×3 IMPLANT
GOWN STRL REUS W/TWL LRG LVL3 (GOWN DISPOSABLE) ×3
GOWN STRL REUS W/TWL XL LVL3 (GOWN DISPOSABLE) ×6
HANDPIECE INTERPULSE COAX TIP (DISPOSABLE) ×3
HOOD PEEL AWAY FACE SHEILD DIS (HOOD) ×9 IMPLANT
KIT BASIN OR (CUSTOM PROCEDURE TRAY) ×3 IMPLANT
KIT ROOM TURNOVER OR (KITS) ×3 IMPLANT
KNEE CAPITATED TOTAL 3 ×1 IMPLANT
MANIFOLD NEPTUNE II (INSTRUMENTS) ×3 IMPLANT
NEEDLE 22X1 1/2 (OR ONLY) (NEEDLE) ×6 IMPLANT
NS IRRIG 1000ML POUR BTL (IV SOLUTION) ×3 IMPLANT
PACK TOTAL JOINT (CUSTOM PROCEDURE TRAY) ×3 IMPLANT
PACK UNIVERSAL I (CUSTOM PROCEDURE TRAY) ×3 IMPLANT
PAD ARMBOARD 7.5X6 YLW CONV (MISCELLANEOUS) ×6 IMPLANT
PADDING CAST COTTON 6X4 STRL (CAST SUPPLIES) ×3 IMPLANT
SET HNDPC FAN SPRY TIP SCT (DISPOSABLE) ×1 IMPLANT
SPONGE GAUZE 4X4 12PLY STER LF (GAUZE/BANDAGES/DRESSINGS) ×3 IMPLANT
STAPLER VISISTAT 35W (STAPLE) ×3 IMPLANT
SUCTION FRAZIER HANDLE 10FR (MISCELLANEOUS) ×2
SUCTION TUBE FRAZIER 10FR DISP (MISCELLANEOUS) ×1 IMPLANT
SUT BONE WAX W31G (SUTURE) ×3 IMPLANT
SUT VIC AB 0 CTB1 27 (SUTURE) ×6 IMPLANT
SUT VIC AB 1 CT1 27 (SUTURE) ×6
SUT VIC AB 1 CT1 27XBRD ANBCTR (SUTURE) ×2 IMPLANT
SUT VIC AB 2-0 CT1 27 (SUTURE) ×6
SUT VIC AB 2-0 CT1 TAPERPNT 27 (SUTURE) ×2 IMPLANT
SYR 20CC LL (SYRINGE) ×6 IMPLANT
TOWEL OR 17X24 6PK STRL BLUE (TOWEL DISPOSABLE) ×3 IMPLANT
TOWEL OR 17X26 10 PK STRL BLUE (TOWEL DISPOSABLE) ×3 IMPLANT
TRAY CATH 16FR W/PLASTIC CATH (SET/KITS/TRAYS/PACK) ×3 IMPLANT
WATER STERILE IRR 1000ML POUR (IV SOLUTION) ×6 IMPLANT

## 2015-08-04 NOTE — Progress Notes (Signed)
Occupational Therapy Evaluation Patient Details Name: Whitney Campbell MRN: [id number] DOB: 1936/01/18 Today's Date: 08/04/2015    History of Present Illness 80 y.o. female now s/p Lt TKA. PMH: DDD, back surgery, breast cancer.    Clinical Impression   PTA, pt was independent with ADLs and mobility. Pt currently requires min assist for ADLs and functional transfers due to balance deficits. Pt plans to d/c home with 24/7 assistance from her children until Monday. Pt will benefit from continued acute OT to increase independence and safety with ADLs and mobility to allow for safe discharge home. No OT follow up or DME recommended at this time.    Follow Up Recommendations  No OT follow up;Supervision/Assistance - 24 hour    Equipment Recommendations  None recommended by OT    Recommendations for Other Services       Precautions / Restrictions Precautions Precautions: Knee;Fall Precaution Booklet Issued: No Precaution Comments: Reviewed not placing pillow, ice pack or other object under knee Restrictions Weight Bearing Restrictions: Yes LLE Weight Bearing: Weight bearing as tolerated      Mobility Bed Mobility Overal bed mobility: Needs Assistance Bed Mobility: Supine to Sit     Supine to sit: Min assist;HOB elevated     General bed mobility comments: Pt up in chair on OT arrival  Transfers Overall transfer level: Needs assistance Equipment used: Rolling walker (2 wheeled) Transfers: Sit to/from [facility] Sit to Stand: Min assist Stand pivot transfers: Min guard       General transfer comment: Min assist for boost to stand and for balance. Verbal cues for safe hand placement on seated surfaces.    Balance Overall balance assessment: Needs assistance Sitting-balance support: No upper extremity supported;Feet supported Sitting balance-Leahy Scale: Good     Standing balance support: Bilateral upper extremity supported;During functional  activity Standing balance-Leahy Scale: Poor Standing balance comment: Reliant on UE support from RW to maintain balance upon standing                            ADL Overall ADL's : Needs assistance/impaired                 Upper Body Dressing : Set up;Sitting   Lower Body Dressing: Set up;Sit to/from stand   Toilet Transfer: Min guard;Cueing for safety;Ambulation;BSC;RW Toilet Transfer Details (indicate cue type and reason): BSC over toilet, cues for safe hand placement Toileting- Clothing Manipulation and Hygiene: Min guard;Sit to/from stand       Functional mobility during ADLs: Min guard;Rolling walker General ADL Comments: Reviewed knee precautions, 0 degree bone foam/CPM use, and began education on compensatory strategies for ADLs.     Vision Vision Assessment?: No apparent visual deficits   Perception     Praxis      Pertinent Vitals/Pain Pain Assessment: 0-10 Pain Score: 7  Pain Location: L knee Pain Descriptors / Indicators: Aching;Sore Pain Intervention(s): Limited activity within patient's tolerance;Monitored during session;Repositioned;Premedicated before session     Hand Dominance Right   Extremity/Trunk Assessment Upper Extremity Assessment Upper Extremity Assessment: Overall WFL for tasks assessed   Lower Extremity Assessment Lower Extremity Assessment: LLE deficits/detail LLE Deficits / Details: decreased ROM and strength as expected post op   Cervical / Trunk Assessment Cervical / Trunk Assessment: Normal   Communication Communication Communication: No difficulties   Cognition Arousal/Alertness: Awake/alert Behavior During Therapy: WFL for tasks assessed/performed Overall Cognitive Status: Within Functional Limits for tasks assessed  General Comments       Exercises       Shoulder Instructions      Home Living Family/patient expects to be discharged to:: Private residence Living  Arrangements: Alone Available Help at Discharge: Family;Available 24 hours/day (until Monday) Type of Home: House Home Access: Stairs to enter [employer] of Steps: 1   Home Layout: One level     Bathroom Shower/Tub: Walk-in shower;Door   [employer] Toilet: Standard     Home Equipment: Environmental consultant - 2 wheels;Cane - single point;Bedside commode;Shower seat;Hand held shower head          Prior Functioning/Environment Level of Independence: Independent             OT Diagnosis: Acute pain   OT Problem List: Decreased strength;Decreased range of motion;Decreased activity tolerance;Impaired balance (sitting and/or standing);Decreased safety awareness;Decreased knowledge of use of DME or AE;Decreased knowledge of precautions;Pain   OT Treatment/Interventions: Self-care/ADL training;Therapeutic exercise;DME and/or AE instruction;Energy conservation;Therapeutic activities;Patient/family education;Balance training    OT Goals(Current goals can be found in the care plan section) Acute Rehab OT Goals Patient Stated Goal: go home OT Goal Formulation: With patient Time For Goal Achievement: 08/18/15 Potential to Achieve Goals: Good ADL Goals Pt Will Perform Grooming: with modified independence;standing Pt Will Perform Lower Body Bathing: with modified independence;sit to/from stand Pt Will Perform Lower Body Dressing: with modified independence;sit to/from stand Pt Will Transfer to Toilet: with modified independence;ambulating;bedside commode (over toilet) Pt Will Perform Toileting - Clothing Manipulation and hygiene: with modified independence;sit to/from stand;sitting/lateral leans Pt Will Perform Tub/Shower Transfer: Shower transfer;with modified independence;shower seat;ambulating;rolling walker  OT Frequency: Min 2X/week   Barriers to D/C:            Co-evaluation              End of Session Equipment Utilized During Treatment: Gait belt;Rolling walker CPM  Left Knee CPM Left Knee: Off Additional Comments: 1441 Nurse Communication: Mobility status  Activity Tolerance: Patient tolerated treatment well Patient left: in chair;with call bell/phone within reach;with family/visitor present;Other (comment) (0 degree bone foam applied)   Time: [id number] OT Time Calculation (min): 15 min Charges:  OT General Charges $OT Visit: 1 Procedure OT Evaluation $OT Eval Moderate Complexity: 1 Procedure G-Codes:    Redmond Baseman, OTR/L [phone] 08/04/2015, 4:35 PM

## 2015-08-04 NOTE — Anesthesia Preprocedure Evaluation (Addendum)
Anesthesia Evaluation  Patient identified by MRN, date of birth, ID band Patient awake    Reviewed: Allergy & Precautions, NPO status , Patient's Chart, lab work & pertinent test results  Airway Mallampati: II  TM Distance: >3 FB Neck ROM: Full    Dental  (+) Teeth Intact   Pulmonary    breath sounds clear to auscultation       Cardiovascular  Rhythm:Regular     Neuro/Psych    GI/Hepatic   Endo/Other    Renal/GU      Musculoskeletal   Abdominal   Peds  Hematology   Anesthesia Other Findings   Reproductive/Obstetrics                            Anesthesia Physical Anesthesia Plan  ASA: III  Anesthesia Plan: MAC and Spinal   Post-op Pain Management:    Induction: Intravenous  Airway Management Planned: Natural Airway and Simple Face Mask  Additional Equipment:   Intra-op Plan:   Post-operative Plan:   Informed Consent: I have reviewed the patients History and Physical, chart, labs and discussed the procedure including the risks, benefits and alternatives for the proposed anesthesia with the patient or authorized representative who has indicated his/her understanding and acceptance.     Plan Discussed with: CRNA and Anesthesiologist  Anesthesia Plan Comments:         Anesthesia Quick Evaluation

## 2015-08-04 NOTE — Transfer of Care (Signed)
Immediate Anesthesia Transfer of Care Note  Patient: Whitney Campbell  Procedure(s) Performed: Procedure(s): LEFT TOTAL KNEE ARTHROPLASTY (Left)  Patient Location: PACU  Anesthesia Type:MAC and Spinal  Level of Consciousness: awake, alert , oriented and sedated  Airway & Oxygen Therapy: Patient Spontanous Breathing and Patient connected to nasal cannula oxygen  Post-op Assessment: Report given to RN, Post -op Vital signs reviewed and stable and Patient moving all extremities  Post vital signs: Reviewed and stable  Last Vitals:  Filed Vitals:   08/04/15 0559  Pulse: 88  Temp: 36.7 C  Resp: 20    Complications: No apparent anesthesia complications

## 2015-08-04 NOTE — Anesthesia Procedure Notes (Signed)
Spinal Patient location during procedure: OR Start time: 08/04/2015 7:45 AM End time: 08/04/2015 7:50 AM Staffing Performed by: anesthesiologist  Preanesthetic Checklist Completed: patient identified, site marked, surgical consent, pre-op evaluation, timeout performed, IV checked, risks and benefits discussed and monitors and equipment checked Spinal Block Patient position: right lateral decubitus Prep: Betadine Patient monitoring: heart rate, cardiac monitor, continuous pulse ox and blood pressure Approach: right paramedian Location: L4-5 Injection technique: single-shot Needle Needle type: Tuohy  Needle gauge: 22 G Assessment Sensory level: T6 Additional Notes 10 mg 0.75% Bupivacaine injected easily

## 2015-08-04 NOTE — Anesthesia Postprocedure Evaluation (Signed)
Anesthesia Post Note  Patient: Whitney Campbell  Procedure(s) Performed: Procedure(s) (LRB): LEFT TOTAL KNEE ARTHROPLASTY (Left)  Patient location during evaluation: PACU Anesthesia Type: MAC and Spinal Level of consciousness: awake, awake and alert and oriented Pain management: pain level controlled Vital Signs Assessment: post-procedure vital signs reviewed and stable Respiratory status: spontaneous breathing, nonlabored ventilation and respiratory function stable Cardiovascular status: blood pressure returned to baseline Postop Assessment: no headache and spinal receding Anesthetic complications: no    Last Vitals:  Filed Vitals:   08/04/15 1055 08/04/15 1059  BP:    Pulse: 80   Temp:  36.4 C  Resp: 14     Last Pain:  Filed Vitals:   08/04/15 1414  PainSc: 9                  Selso Mannor COKER

## 2015-08-04 NOTE — Evaluation (Signed)
Physical Therapy Evaluation Patient Details Name: Whitney Campbell MRN: [id number] DOB: [birth year] Today's Date: 08/04/2015   History of Present Illness  80 y.o. female now s/p Lt TKA. PMH: DDD, back surgery, breast cancer.   Clinical Impression  Pt is s/p TKA resulting in the deficits listed below (see PT Problem List).  Pt will benefit from skilled PT to increase their independence and safety with mobility to allow discharge to home with family assistance. It was reported that family will provide 24 hour care initially. Pt in agreement with D/C to home when stable.      Follow Up Recommendations Home health PT;Supervision for mobility/OOB    Equipment Recommendations  None recommended by PT;Other (comment) (pt has equipment already)    Recommendations for Other Services       Precautions / Restrictions Precautions Precautions: Knee;Fall Precaution Booklet Issued: Yes (comment) Precaution Comments: HEP provided and reviewed knee extension  Restrictions Weight Bearing Restrictions: Yes LLE Weight Bearing: Weight bearing as tolerated      Mobility  Bed Mobility Overal bed mobility: Needs Assistance Bed Mobility: Supine to Sit     Supine to sit: Min assist;HOB elevated     General bed mobility comments: Min assist with LLE  Transfers Overall transfer level: Needs assistance Equipment used: Rolling walker (2 wheeled) Transfers: Sit to/from Stand Sit to Stand: Min assist         General transfer comment: cues for hand position  Ambulation/Gait Ambulation/Gait assistance: Min guard Ambulation Distance (Feet): 12 Feet Assistive device: Rolling walker (2 wheeled) Gait Pattern/deviations: Step-to pattern;Decreased step length - left;Decreased weight shift to left Gait velocity: decreased   General Gait Details: slow pattern, no loss of balance  Stairs            Wheelchair Mobility    Modified Rankin (Stroke Patients Only)       Balance Overall  balance assessment: Needs assistance Sitting-balance support: No upper extremity supported Sitting balance-Leahy Scale: Good     Standing balance support: Bilateral upper extremity supported Standing balance-Leahy Scale: Poor Standing balance comment: using rw                             Pertinent Vitals/Pain Pain Assessment: 0-10 Pain Score: 6  Pain Location: Lt knee Pain Descriptors / Indicators: Tightness Pain Intervention(s): Limited activity within patient's tolerance;Monitored during session    Home Living Family/patient expects to be discharged to:: Private residence Living Arrangements: Alone Available Help at Discharge: Family;Available 24 hours/day Type of Home: House Home Access: Stairs to enter   [employer] of Steps: 1 Home Layout: One level Home Equipment: Walker - 2 wheels;Cane - single point      Prior Function Level of Independence: Independent               Hand Dominance        Extremity/Trunk Assessment               Lower Extremity Assessment: LLE deficits/detail   LLE Deficits / Details: assist needed for SLR     Communication   Communication: No difficulties  Cognition Arousal/Alertness: Awake/alert Behavior During Therapy: WFL for tasks assessed/performed Overall Cognitive Status: Within Functional Limits for tasks assessed                      General Comments      Exercises        Assessment/Plan    PT  Assessment Patient needs continued PT services  PT Diagnosis Difficulty walking;Abnormality of gait;Generalized weakness;Acute pain   PT Problem List Decreased strength;Decreased range of motion;Decreased activity tolerance;Decreased balance;Decreased mobility;Pain  PT Treatment Interventions DME instruction;Gait training;Stair training;Functional mobility training;Therapeutic activities;Therapeutic exercise;Balance training;Patient/family education   PT Goals (Current goals can be  found in the Care Plan section) Acute Rehab PT Goals Patient Stated Goal: go home PT Goal Formulation: With patient Time For Goal Achievement: 08/18/15 Potential to Achieve Goals: Good    Frequency 7X/week   Barriers to discharge        Co-evaluation               End of Session Equipment Utilized During Treatment: Gait belt Activity Tolerance: Patient tolerated treatment well Patient left: in chair;with call bell/phone within reach;with family/visitor present;Other (comment) (in knee extension) Nurse Communication: Mobility status;Weight bearing status         Time: [id number] PT Time Calculation (min) (ACUTE ONLY): 28 min   Charges:   PT Evaluation $PT Eval Moderate Complexity: 1 Procedure PT Treatments $Gait Training: 8-22 mins   PT G Codes:        Cassell Clement, PT, CSCS Pager [phone] Office [phone]  08/04/2015, 3:40 PM

## 2015-08-04 NOTE — H&P (Signed)
Whitney Campbell MRN:  [id number] DOB/SEX:  July 14, 1935/female  CHIEF COMPLAINT:  Painful left Knee  HISTORY: Patient is a 80 y.o. female presented with a history of pain in the left knee. Onset of symptoms was gradual starting a few years ago with gradually worsening course since that time. Patient has been treated conservatively with over-the-counter NSAIDs and activity modification. Patient currently rates pain in the knee at 10 out of 10 with activity. There is pain at night.  PAST MEDICAL HISTORY: There are no active problems to display for this patient.  Past Medical History  Diagnosis Date  . Elevated cholesterol   . Pneumothorax     congential  defect x 3 surgery corrected in 1976  . Migraine   . Breast cancer ([place])   . DDD (degenerative disc disease), lumbar   . Sciatic pain   . Headache     occ migraines  . Arthritis   . Cancer [facility]) 2012    breast left   Past Surgical History  Procedure Laterality Date  . Mastectomy      right  . Lung surgery      1976  . Back surgery    . Cataract extraction      bilateral  . Dental surgery    . Plantar fascitis    . Biopsy thyroid    . Breast surgery Left 2012     lumpectomy  . Abdominal hysterectomy  82  . Video assisted thoracoscopy (vats) w/talc pleuadesis Left 76    collapsed lung  patched  . Back surgery  2000  . Eye surgery Bilateral 07    cataracts  . Bladder tuck       MEDICATIONS:   Prescriptions prior to admission  Medication Sig Dispense Refill Last Dose  . aspirin 81 MG tablet Take 81 mg by mouth daily.   06/24/15  . Biotin 5000 MCG TABS Take 1 tablet by mouth daily.     Marland Kitchen CINNAMON PO Take 1 capsule by mouth daily.     . Coconut Oil 1000 MG CAPS Take 1 capsule by mouth daily.   07/14/15  . Cranberry Extract 250 MG TABS Take 1 capsule by mouth daily.     . fesoterodine (TOVIAZ) 4 MG TB24 tablet Take 4 mg by mouth daily.     . magnesium oxide (MAG-OX) 400 MG tablet Take 400 mg by mouth daily.     .  rizatriptan (MAXALT) 10 MG tablet Take 10 mg by mouth as needed for migraine. May repeat in 2 hours if needed     . rOPINIRole (REQUIP) 3 MG tablet Take 1.5-3 mg by mouth at bedtime.     . tamoxifen (NOLVADEX) 20 MG tablet Take 20 mg by mouth daily. Patient to complete Oct 2017     . traMADol (ULTRAM) 50 MG tablet Take 50 mg by mouth every 6 (six) hours as needed for moderate pain.     . vitamin B-12 (CYANOCOBALAMIN) 1000 MCG tablet Take 1,000 mcg by mouth daily.     . vitamin C (ASCORBIC ACID) 500 MG tablet Take 500 mg by mouth daily.     . vitamin E 400 UNIT capsule Take 400 Units by mouth daily.     . Ascorbic Acid (VITAMIN C) 100 MG tablet Take 100 mg by mouth daily.     . Ascorbic Acid (VITAMIN C) 1000 MG tablet Take 1,000 mg by mouth daily.     Marland Kitchen aspirin 81 MG tablet Take 81 mg by mouth  daily.     . Cinnamon 500 MG capsule Take 500 mg by mouth daily.     . clonazePAM (KLONOPIN) 0.5 MG tablet Take 0.5 mg by mouth 2 (two) times daily as needed.     Marland Kitchen DHEA 25 MG CAPS Take by mouth.     . etodolac (LODINE) 400 MG tablet Take 400 mg by mouth 2 (two) times daily.     Marland Kitchen HYDROcodone-acetaminophen (NORCO/VICODIN) 5-325 MG tablet Take 1 tablet by mouth every 6 (six) hours as needed for moderate pain.     Marland Kitchen ibuprofen (ADVIL,MOTRIN) 600 MG tablet Take 600 mg by mouth every 6 (six) hours as needed.     Marland Kitchen letrozole (FEMARA) 2.5 MG tablet Take 2.5 mg by mouth daily.     . magnesium gluconate (MAGONATE) 500 MG tablet Take 250 mg by mouth 2 (two) times daily.     . meloxicam (MOBIC) 15 MG tablet Take 15 mg by mouth daily.     . Multiple Vitamins-Minerals (VISION-VITE PRESERVE PO) Take by mouth 2 (two) times daily.     . nortriptyline (PAMELOR) 10 MG capsule Take 10 mg by mouth at bedtime.     Marland Kitchen oxyCODONE-acetaminophen (ROXICET) 5-325 MG per tablet Take 1 tablet by mouth every 4 (four) hours as needed for pain. 20 tablet 0   . rizatriptan (MAXALT) 10 MG tablet Take 10 mg by mouth as needed. May repeat in 2  hours if needed     . venlafaxine (EFFEXOR) 100 MG tablet Take 100 mg by mouth 2 (two) times daily.     . vitamin B-12 (CYANOCOBALAMIN) 500 MCG tablet Take 500 mcg by mouth daily.     Marland Kitchen zinc gluconate 50 MG tablet Take 50 mg by mouth daily.     Marland Kitchen zinc gluconate 50 MG tablet Take 50 mg by mouth daily.       ALLERGIES:   Allergies  Allergen Reactions  . Morphine And Related Nausea And Vomiting    REVIEW OF SYSTEMS:  A comprehensive review of systems was negative except for: Musculoskeletal: positive for arthralgias and stiff joints   FAMILY HISTORY:  No family history on file.  SOCIAL HISTORY:   Social History  Substance Use Topics  . Smoking status: Never Smoker   . Smokeless tobacco: Not on file  . Alcohol Use: No     EXAMINATION:  Vital signs in last 24 hours: Temp:  [98.1 F (36.7 C)] 98.1 F (36.7 C) (04/10 0559) Pulse Rate:  [88] 88 (04/10 0559) Resp:  [20] 20 (04/10 0559) SpO2:  [100 %] 100 % (04/10 0559) Weight:  [90.266 kg (199 lb)] 90.266 kg (199 lb) (04/10 0559)  Pulse 88  Temp(Src) 98.1 F (36.7 C)  Resp 20  Ht 5\' 7"  (1.702 m)  Wt 90.266 kg (199 lb)  BMI 31.16 kg/m2  SpO2 100% Head: Normocephalic, without obvious abnormality, atraumatic Lungs: clear to auscultation bilaterally Heart: regular rate and rhythm, S1, S2 normal, no murmur, click, rub or gallop Abdomen: soft, non-tender; bowel sounds normal; no masses,  no organomegaly Extremities: extremities normal, atraumatic, no cyanosis or edema Pulses: 2+ and symmetric Skin: Skin color, texture, turgor normal. No rashes or lesions Neurologic: Grossly normal  Musculoskeletal:  ROM 0-90, Ligaments intact,  Imaging Review Plain radiographs demonstrate severe degenerative joint disease of the left knee. The overall alignment is neutral. The bone quality appears to be good for age and reported activity level.  Assessment/Plan: Primary osteoarthritis, left knee   The patient history,  physical  examination and imaging studies are consistent with advanced degenerative joint disease of the left knee. The patient has failed conservative treatment.  The clearance notes were reviewed.  After discussion with the patient it was felt that Total Knee Replacement was indicated. The procedure,  risks, and benefits of total knee arthroplasty were presented and reviewed. The risks including but not limited to aseptic loosening, infection, blood clots, vascular injury, stiffness, patella tracking problems complications among others were discussed. The patient acknowledged the explanation, agreed to proceed with the plan.  Donia Ast 08/04/2015, 6:20 AM

## 2015-08-04 NOTE — Progress Notes (Signed)
Orthopedic Tech Progress Note Patient Details:  Whitney Campbell 04-25-1936 [id number]  CPM Left Knee CPM Left Knee: On Left Knee Flexion (Degrees): 90 Left Knee Extension (Degrees): 0 Additional Comments: trapeze bar patient helper Viewed order from doctor's order list  Hildred Priest 08/04/2015, 10:23 AM

## 2015-08-05 ENCOUNTER — Encounter (HOSPITAL_COMMUNITY): Payer: Self-pay | Admitting: Orthopedic Surgery

## 2015-08-05 LAB — CBC
HCT: 30.6 % — ABNORMAL LOW (ref 36.0–46.0)
HEMOGLOBIN: 9.4 g/dL — AB (ref 12.0–15.0)
MCH: 29.4 pg (ref 26.0–34.0)
MCHC: 30.7 g/dL (ref 30.0–36.0)
MCV: 95.6 fL (ref 78.0–100.0)
PLATELETS: 98 10*3/uL — AB (ref 150–400)
RBC: 3.2 MIL/uL — AB (ref 3.87–5.11)
RDW: 14.4 % (ref 11.5–15.5)
WBC: 6.8 10*3/uL (ref 4.0–10.5)

## 2015-08-05 LAB — BASIC METABOLIC PANEL
Anion gap: 8 (ref 5–15)
BUN: 18 mg/dL (ref 6–20)
CALCIUM: 9 mg/dL (ref 8.9–10.3)
CO2: 21 mmol/L — ABNORMAL LOW (ref 22–32)
CREATININE: 0.95 mg/dL (ref 0.44–1.00)
Chloride: 109 mmol/L (ref 101–111)
GFR calc Af Amer: >60 mL/min (ref >60)
GFR, EST NON AFRICAN AMERICAN: 56 mL/min — AB (ref >60)
Glucose, Bld: 125 mg/dL — ABNORMAL HIGH (ref 65–99)
POTASSIUM: 4.3 mmol/L (ref 3.5–5.1)
SODIUM: 138 mmol/L (ref 135–145)

## 2015-08-05 MED ORDER — OXYCODONE HCL ER 10 MG PO T12A: 10.0000 mg | EXTENDED_RELEASE_TABLET | Freq: Two times a day (BID) | ORAL | Status: DC

## 2015-08-05 MED ORDER — ONDANSETRON HCL 4 MG PO TABS: 4.0000 mg | ORAL_TABLET | Freq: Four times a day (QID) | ORAL | Status: DC | PRN

## 2015-08-05 MED ORDER — ENOXAPARIN SODIUM 40 MG/0.4ML ~~LOC~~ SOLN: 40.0000 mg | SUBCUTANEOUS | Status: DC

## 2015-08-05 MED ORDER — METHOCARBAMOL 500 MG PO TABS: 500.0000 mg | ORAL_TABLET | Freq: Four times a day (QID) | ORAL | Status: DC | PRN

## 2015-08-05 MED ORDER — OXYCODONE HCL 5 MG PO TABS: 5.0000 mg | ORAL_TABLET | ORAL | Status: DC | PRN

## 2015-08-05 NOTE — Progress Notes (Signed)
Physical Therapy Treatment Patient Details Name: Whitney Campbell MRN: [id number] DOB: 1935-12-02 Today's Date: 08/05/2015    History of Present Illness 80 y.o. female now s/p Lt TKA. PMH: DDD, back surgery, breast cancer.     PT Comments    Patient is progressing toward mobility goals. Patient needs to practice stairs next session.     Follow Up Recommendations  Home health PT;Supervision for mobility/OOB     Equipment Recommendations  None recommended by PT;Other (comment) (pt has equipment already)    Recommendations for Other Services       Precautions / Restrictions Precautions Precautions: Knee;Fall Precaution Booklet Issued: Yes (comment) Precaution Comments: HEP provided and reviewed knee extension  Restrictions Weight Bearing Restrictions: Yes LLE Weight Bearing: Weight bearing as tolerated    Mobility  Bed Mobility Overal bed mobility: Needs Assistance Bed Mobility: Supine to Sit     Supine to sit: Min guard     General bed mobility comments: OOB in chair upon arrival  Transfers Overall transfer level: Needs assistance Equipment used: Rolling walker (2 wheeled) Transfers: Sit to/from Stand Sit to Stand: Min assist         General transfer comment: cues for hand placement and technique; assist to power up into standing and slowly descend to chair  Ambulation/Gait Ambulation/Gait assistance: Min guard Ambulation Distance (Feet): 50 Feet Assistive device: Rolling walker (2 wheeled) Gait Pattern/deviations: Step-through pattern;Decreased stance time - left;Decreased step length - right;Decreased weight shift to left;Antalgic;Trunk flexed Gait velocity: decreased   General Gait Details: cues for L heel strike, posture, and position of RW; slow cadence    Stairs            Wheelchair Mobility    Modified Rankin (Stroke Patients Only)       Balance Overall balance assessment: Needs assistance Sitting-balance support: No upper extremity  supported;Feet supported Sitting balance-Leahy Scale: Good     Standing balance support: Bilateral upper extremity supported;During functional activity Standing balance-Leahy Scale: Poor Standing balance comment: LOB x1 with staggering steps when ambulating to bathroom and required min assist to regain balance.                    Cognition Arousal/Alertness: Lethargic;Suspect due to medications Behavior During Therapy: [facility] for tasks assessed/performed Overall Cognitive Status: Within Functional Limits for tasks assessed                      Exercises Total Joint Exercises Quad Sets: AROM;Left;5 reps;Seated Heel Slides: AROM;Left;5 reps;Seated Goniometric ROM: 0-85    General Comments        Pertinent Vitals/Pain Pain Assessment: Faces Pain Score: 5  Faces Pain Scale: Hurts even more Pain Location: L knee with mobility Pain Descriptors / Indicators: Sore Pain Intervention(s): Limited activity within patient's tolerance;Monitored during session;Premedicated before session;Repositioned;Ice applied    Home Living                      Prior Function            PT Goals (current goals can now be found in the care plan section) Acute Rehab PT Goals Patient Stated Goal: go home PT Goal Formulation: With patient Time For Goal Achievement: 08/18/15 Potential to Achieve Goals: Good Progress towards PT goals: Progressing toward goals    Frequency  7X/week    PT Plan Current plan remains appropriate    Co-evaluation  End of Session Equipment Utilized During Treatment: Gait belt Activity Tolerance: Patient limited by lethargy Patient left: in chair;with call bell/phone within reach;with family/visitor present     Time: 1111-1140 PT Time Calculation (min) (ACUTE ONLY): 29 min  Charges:  $Gait Training: 8-22 mins $Therapeutic Activity: 8-22 mins                    G Codes:      Salina April, PTA Pager:  [phone]   08/05/2015, 11:52 AM

## 2015-08-05 NOTE — Care Management Note (Signed)
Case Management Note  Patient Details  Name: Caidence Deas MRN: [id number] Date of Birth: [birth year]  Subjective/Objective:           S/p left total knee arthroplasty         Action/Plan: Set up with [facility] for HHPT by MD office. Spoke with patient, no change in discharge plan. [employer] is delivering CPM to patient's home, she has rolling walker and 3N1 from previous surgery. Patient's four daughters will be assisting her after discharge.  Expected Discharge Date:                  Expected Discharge Plan:  [place]  In-House Referral:  NA  Discharge planning Services  CM Consult  Post Acute Care Choice:  Durable Medical Equipment, Home Health Choice offered to:  Patient  DME Arranged:  CPM DME Agency:  [employer]  HH Arranged:  PT HH Agency:  [facility]  Status of Service:  Completed, signed off  Medicare Important Message Given:    Date Medicare IM Given:    Medicare IM give by:    Date Additional Medicare IM Given:    Additional Medicare Important Message give by:     If discussed at [place] of Stay Meetings, dates discussed:    Additional Comments:  Nila Nephew, RN 08/05/2015, 12:05 PM

## 2015-08-05 NOTE — Progress Notes (Signed)
SPORTS MEDICINE AND JOINT REPLACEMENT  Lara Mulch, MD   [facility] PA-C [place], [place], [place]  [address]                             [phone]   PROGRESS NOTE  Subjective:  negative for Chest Pain  negative for Shortness of Breath  negative for Nausea/Vomiting   negative for Calf Pain  negative for Bowel Movement   Tolerating Diet: yes         Patient reports pain as 6 on 0-10 scale.    Objective: Vital signs in last 24 hours:   Patient Vitals for the past 24 hrs:  BP Temp Temp src Pulse Resp SpO2  08/05/15 0200 (!) 119/55 mmHg 98.1 F (36.7 C) - 90 - 96 %  08/04/15 2200 138/62 mmHg 98.2 F (36.8 C) Oral 90 13 98 %  08/04/15 1121 - - - - - 99 %  08/04/15 1059 - 97.5 F (36.4 C) - - - -  08/04/15 1055 - - - 80 14 99 %  08/04/15 1052 - - - 84 18 100 %  08/04/15 1051 (!) 119/59 mmHg - - 79 (!) 8 100 %  08/04/15 1036 (!) 123/55 mmHg - - 77 (!) 9 100 %  08/04/15 1021 113/68 mmHg - - 84 16 100 %  08/04/15 1006 114/64 mmHg - - 84 20 100 %  08/04/15 0951 (!) 105/50 mmHg - - 81 20 100 %  08/04/15 0936 (!) 90/59 mmHg 97.2 F (36.2 C) - 87 12 100 %    @flow {1959:LAST@   Intake/Output from previous day:   04/10 0701 - 04/11 0700 In: 1300 [I.V.:1200] Out: 500 [Urine:400]   Intake/Output this shift:       Intake/Output      04/10 0701 - 04/11 0700   I.V. (mL/kg) 1200 (13.3)   IV Piggyback 100   Total Intake(mL/kg) 1300 (14.4)   Urine (mL/kg/hr) 400 (0.2)   Blood 100 (0)   Total Output 500   Net +800       Urine Occurrence 1 x      LABORATORY DATA:  Recent Labs  08/04/15 1148  WBC 4.7  HGB 10.3*  HCT 32.3*  PLT 117*    Recent Labs  08/04/15 1148  CREATININE 1.27*   Lab Results  Component Value Date   INR 1.08 07/30/2015    Examination:  General appearance: alert, cooperative and no distress Extremities: extremities normal, atraumatic, no cyanosis or edema  Wound Exam: clean, dry, intact   Drainage:  None: wound  tissue dry  Motor Exam: Quadriceps and Hamstrings Intact  Sensory Exam: Superficial Peroneal, Deep Peroneal and Tibial normal   Assessment:    1 Day Post-Op  Procedure(s) (LRB): LEFT TOTAL KNEE ARTHROPLASTY (Left)  ADDITIONAL DIAGNOSIS:  Active Problems:   S/P total knee arthroplasty  Acute Blood Loss Anemia   Plan: Physical Therapy as ordered Weight Bearing as Tolerated (WBAT)  DVT Prophylaxis:  Lovenox  DISCHARGE PLAN: Home  DISCHARGE NEEDS: HHPT   Patient looks great, will discharge today.         Donia Ast 08/05/2015, 6:59 AM

## 2015-08-05 NOTE — Progress Notes (Signed)
Physical Therapy Treatment Patient Details Name: Whitney Campbell MRN: [id number] DOB: 04-14-1936 Today's Date: 08/05/2015    History of Present Illness 80 y.o. female now s/p Lt TKA. PMH: DDD, back surgery, breast cancer.     PT Comments    Patient continues to do well with PT. Stair training complete. Daughter present for session and actively participating.   Follow Up Recommendations  Home health PT;Supervision for mobility/OOB     Equipment Recommendations  None recommended by PT;Other (comment) (pt has equipment already)    Recommendations for Other Services       Precautions / Restrictions Precautions Precautions: Knee;Fall Precaution Booklet Issued: Yes (comment) Precaution Comments: HEP provided and reviewed knee extension  Restrictions Weight Bearing Restrictions: Yes LLE Weight Bearing: Weight bearing as tolerated    Mobility  Bed Mobility Overal bed mobility: Needs Assistance Bed Mobility: Supine to Sit     Supine to sit: Min guard     General bed mobility comments: cues for technique; HOB flat and min use of bedrail to scoot hips to EOB; increased time needed but no physical assist  Transfers Overall transfer level: Needs assistance Equipment used: Rolling walker (2 wheeled) Transfers: Sit to/from Stand Sit to Stand: Min guard         General transfer comment: min guard for safety; pt impulsive at times suspect due to medication as she did not demonstrate impulsive behavior this am; cues for hand placement and safe use of AD  Ambulation/Gait Ambulation/Gait assistance: Min guard Ambulation Distance (Feet): 40 Feet Assistive device: Rolling walker (2 wheeled) Gait Pattern/deviations: Step-through pattern;Decreased stance time - left;Decreased step length - right;Decreased weight shift to left;Antalgic;Trunk flexed Gait velocity: decreased   General Gait Details: cues for sequencing, position of RW, and bilat heel strike; pt c/o feeling that her L  knee may buckle but no instability noted; cues to engage L quad before WS to L LE   Stairs Stairs: Yes Stairs assistance: Min guard Stair Management: No rails;Forwards;With walker Number of Stairs: 1 General stair comments: educated on sequencing and technique; daughter present and actively participating  Wheelchair Mobility    Modified Rankin (Stroke Patients Only)       Balance     Sitting balance-Leahy Scale: Good       Standing balance-Leahy Scale: Poor                      Cognition Arousal/Alertness: Lethargic;Suspect due to medications Behavior During Therapy: [facility] for tasks assessed/performed Overall Cognitive Status: Within Functional Limits for tasks assessed                      Exercises Total Joint Exercises Quad Sets: AROM;Left;5 reps;Seated Heel Slides: AROM;Left;5 reps;Seated Goniometric ROM: 0-85    General Comments General comments (skin integrity, edema, etc.): educated pt and daughter on positioning, use of ice, and HEP      Pertinent Vitals/Pain Pain Assessment: 0-10 Pain Score: 6  Faces Pain Scale: Hurts even more Pain Location: L knee Pain Descriptors / Indicators: Sore Pain Intervention(s): Limited activity within patient's tolerance;Monitored during session;Repositioned;Premedicated before session    Home Living                      Prior Function            PT Goals (current goals can now be found in the care plan section) Acute Rehab PT Goals Patient Stated Goal: go home PT Goal Formulation:  With patient Time For Goal Achievement: 08/18/15 Potential to Achieve Goals: Good Progress towards PT goals: Progressing toward goals    Frequency  7X/week    PT Plan Current plan remains appropriate    Co-evaluation             End of Session Equipment Utilized During Treatment: Gait belt Activity Tolerance: Patient limited by lethargy Patient left: in chair;with call bell/phone within reach;with  family/visitor present     Time: [id number] PT Time Calculation (min) (ACUTE ONLY): 21 min  Charges:  $Gait Training: 8-22 mins $Therapeutic Activity: 8-22 mins                    G Codes:      Salina April, PTA Pager: [phone]   08/05/2015, 2:45 PM

## 2015-08-05 NOTE — Progress Notes (Signed)
Occupational Therapy Treatment Patient Details Name: Whitney Campbell MRN: [id number] DOB: 1935/12/25 Today's Date: 08/05/2015    History of present illness 80 y.o. female now s/p Lt TKA. PMH: DDD, back surgery, breast cancer.    OT comments  Pt limited by increased lethargy today and required increased assistance for balance and safety. Reviewed knee precautions, compensatory strategies for ADLs and shower transfer, and fall prevention strategies. Pt required min assist for functional transfers and set up assist for ADLs. SpO2 89-92% on RA 5 minutes; 89% on RA during activity for 10 minutes; pt returned to 2L [place] and SpO2 97% after 1 minute. Will continue to follow acutely to address OT needs and goals.   Follow Up Recommendations  No OT follow up;Supervision/Assistance - 24 hour    Equipment Recommendations  None recommended by OT    Recommendations for Other Services      Precautions / Restrictions Precautions Precautions: Knee;Fall Precaution Booklet Issued: No Precaution Comments: Reviewed not placing pillow, ice pack or other object under knee Restrictions Weight Bearing Restrictions: Yes LLE Weight Bearing: Weight bearing as tolerated       Mobility Bed Mobility Overal bed mobility: Needs Assistance Bed Mobility: Supine to Sit     Supine to sit: Min guard     General bed mobility comments: HOB flat, no use of bedrails to simulate home environment. Increased time and effort and verbal cues for weightshifting to scoot hips EOB.   Transfers Overall transfer level: Needs assistance Equipment used: Rolling walker (2 wheeled) Transfers: Sit to/from Stand Sit to Stand: Min assist         General transfer comment: Min assist for balance and safety due to increase lethargy and pain with movment. Pt with 1 LOB during toilet transfer and required min assist to regain balance.    Balance Overall balance assessment: Needs assistance Sitting-balance support: No upper  extremity supported;Feet supported Sitting balance-Leahy Scale: Good     Standing balance support: Bilateral upper extremity supported;During functional activity Standing balance-Leahy Scale: Poor Standing balance comment: LOB x1 with staggering steps when ambulating to bathroom and required min assist to regain balance.                   ADL Overall ADL's : Needs assistance/impaired     Grooming: Wash/dry hands;Min guard;Standing   Upper Body Bathing: Set up;Sitting   Lower Body Bathing: Set up;Sit to/from stand   Upper Body Dressing : Set up;Sitting   Lower Body Dressing: Set up;Sit to/from stand;Cueing for compensatory techniques Lower Body Dressing Details (indicate cue type and reason): Cues to dress LLE first and undress it last Toilet Transfer: Minimal assistance;Cueing for safety;Ambulation;BSC;RW Toilet Transfer Details (indicate cue type and reason): BSC over toilet, cues for safe hand placement and assist for balance due to lethargy Toileting- Clothing Manipulation and Hygiene: Minimal assistance;Sit to/from stand Toileting - Clothing Manipulation Details (indicate cue type and reason): for balance due to lethargy   Tub/Shower Transfer Details (indicate cue type and reason): Discussed shower transfer and proper step sequence with RW. Pt too lethargic to attempt safely Functional mobility during ADLs: Minimal assistance;Rolling walker General ADL Comments: Pt with increased lethargy and required more assistance for balance and safety. Reviewed knee precautions, compensatory strategies for ADLs and shower transfer. Pt's SpO2 on RA at rest was 93% and desat to 89% with activity.      Vision  Perception     Praxis      Cognition   Behavior During Therapy: WFL for tasks assessed/performed Overall Cognitive Status: Within Functional Limits for tasks assessed                       Extremity/Trunk Assessment                Exercises     Shoulder Instructions       General Comments      Pertinent Vitals/ Pain       Pain Assessment: 0-10 Pain Score: 5  Pain Location: L knee Pain Descriptors / Indicators: Aching;Sore Pain Intervention(s): Limited activity within patient's tolerance;Monitored during session;Premedicated before session;Repositioned;Ice applied  Home Living                                          Prior Functioning/Environment              Frequency       Progress Toward Goals  OT Goals(current goals can now be found in the care plan section)  Progress towards OT goals: Progressing toward goals  Acute Rehab OT Goals Patient Stated Goal: go home OT Goal Formulation: With patient Time For Goal Achievement: 08/18/15 Potential to Achieve Goals: Good ADL Goals Pt Will Perform Grooming: with modified independence;standing Pt Will Perform Lower Body Bathing: with modified independence;sit to/from stand Pt Will Perform Lower Body Dressing: with modified independence;sit to/from stand Pt Will Transfer to Toilet: with modified independence;ambulating;bedside commode Pt Will Perform Toileting - Clothing Manipulation and hygiene: with modified independence;sit to/from stand;sitting/lateral leans Pt Will Perform Tub/Shower Transfer: Shower transfer;with modified independence;shower seat;ambulating;rolling walker  Plan Discharge plan remains appropriate    Co-evaluation                 End of Session Equipment Utilized During Treatment: Gait belt;Rolling walker CPM Left Knee CPM Left Knee: Off   Activity Tolerance Patient limited by lethargy   Patient Left in chair;with call bell/phone within reach;Other (comment) (0 degree bone foam applied)   Nurse Communication Mobility status        Time: [id number] OT Time Calculation (min): 25 min  Charges: OT General Charges $OT Visit: 1 Procedure OT Treatments $Self Care/Home Management : 23-37  mins  Redmond Baseman, OTR/L Pager: [phone] 08/05/2015, 10:34 AM

## 2015-08-05 NOTE — Progress Notes (Signed)
Utilization review completed.  

## 2015-08-05 NOTE — Op Note (Signed)
TOTAL KNEE REPLACEMENT OPERATIVE NOTE:  08/04/2015  2:04 PM  PATIENT:  Whitney Campbell  80 y.o. female  PRE-OPERATIVE DIAGNOSIS:  primary osteoarthritis left knee  POST-OPERATIVE DIAGNOSIS:  primary osteoarthritis left knee  PROCEDURE:  Procedure(s): LEFT TOTAL KNEE ARTHROPLASTY  SURGEON:  Surgeon(s): Vickey Huger, MD  PHYSICIAN ASSISTANT: Carlyon Shadow, [facility]  ANESTHESIA:   spinal  DRAINS: Hemovac  SPECIMEN: None  COUNTS:  Correct  TOURNIQUET:   Total Tourniquet Time Documented: Thigh (Left) - 45 minutes Total: Thigh (Left) - 45 minutes   DICTATION:  Indication for procedure:    The patient is a 80 y.o. female who has failed conservative treatment for primary osteoarthritis left knee.  Informed consent was obtained prior to anesthesia. The risks versus benefits of the operation were explain and in a way the patient can, and did, understand.   On the implant demand matching protocol, this patient scored 8.  Therefore, this patient did" "did not receive a polyethylene insert with vitamin E which is a high demand implant.  Description of procedure:     The patient was taken to the operating room and placed under anesthesia.  The patient was positioned in the usual fashion taking care that all body parts were adequately padded and/or protected.  I foley catheter was not placed.  A tourniquet was applied and the leg prepped and draped in the usual sterile fashion.  The extremity was exsanguinated with the esmarch and tourniquet inflated to 350 mmHg.  Pre-operative range of motion was normal.  The knee was in 5 degree of mild varus.  A midline incision approximately 6-7 inches long was made with a #10 blade.  A new blade was used to make a parapatellar arthrotomy going 2-3 cm into the quadriceps tendon, over the patella, and alongside the medial aspect of the patellar tendon.  A synovectomy was then performed with the #10 blade and forceps. I then elevated the deep MCL off the  medial tibial metaphysis subperiosteally around to the semimembranosus attachment.    I everted the patella and used calipers to measure patellar thickness.  I used the reamer to ream down to appropriate thickness to recreate the native thickness.  I then removed excess bone with the rongeur and sagittal saw.  I used the appropriately sized template and drilled the three lug holes.  I then put the trial in place and measured the thickness with the calipers to ensure recreation of the native thickness.  The trial was then removed and the patella subluxed and the knee brought into flexion.  A homan retractor was place to retract and protect the patella and lateral structures.  A Z-retractor was place medially to protect the medial structures.  The extra-medullary alignment system was used to make cut the tibial articular surface perpendicular to the anamotic axis of the tibia and in 3 degrees of posterior slope.  The cut surface and alignment jig was removed.  I then used the intramedullary alignment guide to make a 6 valgus cut on the distal femur.  I then marked out the epicondylar axis on the distal femur.  The posterior condylar axis measured 3 degrees.  I then used the anterior referencing sizer and measured the femur to be a size 8.  The 4-In-1 cutting block was screwed into place in external rotation matching the posterior condylar angle, making our cuts perpendicular to the epicondylar axis.  Anterior, posterior and chamfer cuts were made with the sagittal saw.  The cutting block and cut pieces  were removed.  A lamina spreader was placed in 90 degrees of flexion.  The ACL, PCL, menisci, and posterior condylar osteophytes were removed.  A 11 mm spacer blocked was found to offer good flexion and extension gap balance after mild in degree releasing.   The scoop retractor was then placed and the femoral finishing block was pinned in place.  The small sagittal saw was used as well as the lug drill to finish  the femur.  The block and cut surfaces were removed and the medullary canal hole filled with autograft bone from the cut pieces.  The tibia was delivered forward in deep flexion and external rotation.  A size D tray was selected and pinned into place centered on the medial 1/3 of the tibial tubercle.  The reamer and keel was used to prepare the tibia through the tray.    I then trialed with the size 8 femur, size D tibia, a 11 mm insert and the 32 patella.  I had excellent flexion/extension gap balance, excellent patella tracking.  Flexion was full and beyond 120 degrees; extension was zero.  These components were chosen and the staff opened them to me on the back table while the knee was lavaged copiously and the cement mixed.  The soft tissue was infiltrated with 60cc of exparel 1.3% through a 21 gauge needle.  I cemented in the components and removed all excess cement.  The polyethylene tibial component was snapped into place and the knee placed in extension while cement was hardening.  The capsule was infilltrated with 30cc of .25% Marcaine with epinephrine.  A hemovac was place in the joint exiting superolaterally.  A pain pump was place superomedially superficial to the arthrotomy.  Once the cement was hard, the tourniquet was let down.  Hemostasis was obtained.  The arthrotomy was closed with figure-8 #1 vicryl sutures.  The deep soft tissues were closed with #0 vicryls and the subcuticular layer closed with a running #2-0 vicryl.  The skin was reapproximated and closed with skin staples.  The wound was dressed with xeroform, 4 x4's, 2 ABD sponges, a single layer of webril and a TED stocking.   The patient was then awakened, extubated, and taken to the recovery room in stable condition.  BLOOD LOSS:  300cc DRAINS: 1 hemovac, 1 pain catheter COMPLICATIONS:  None.  PLAN OF CARE: Admit to inpatient   PATIENT DISPOSITION:  PACU - hemodynamically stable.   Delay start of Pharmacological VTE agent  (>24hrs) due to surgical blood loss or risk of bleeding:  not applicable  Please fax a copy of this op note to my office at [phone] (please only include page 1 and 2 of the Case Information op note)

## 2015-08-11 NOTE — Discharge Summary (Signed)
SPORTS MEDICINE & JOINT REPLACEMENT   Lara Mulch, MD    Carlyon Shadow, PA-C [address], [place], [place]  [address]                             [phone]  PATIENT ID: Whitney Campbell        MRN:  [id number]          DOB/AGE: 78-Aug-1937 / 78 y.o.    DISCHARGE SUMMARY  ADMISSION DATE:    08/04/2015 DISCHARGE DATE:   08/05/2015   ADMISSION DIAGNOSIS: primary osteoarthritis left knee    DISCHARGE DIAGNOSIS:  primary osteoarthritis left knee    ADDITIONAL DIAGNOSIS: Active Problems:   S/P total knee arthroplasty  Past Medical History  Diagnosis Date  . Elevated cholesterol   . Pneumothorax     congential  defect x 3 surgery corrected in 1976  . Migraine   . Breast cancer ([place])   . DDD (degenerative disc disease), lumbar   . Sciatic pain   . Headache     occ migraines  . Arthritis   . Cancer [facility]) 2012    breast left    PROCEDURE: Procedure(s): LEFT TOTAL KNEE ARTHROPLASTY on 08/04/2015  CONSULTS:     HISTORY:  See H&P in chart  HOSPITAL COURSE:  Whitney Campbell is a 80 y.o. admitted on 08/04/2015 and found to have a diagnosis of primary osteoarthritis left knee.  After appropriate laboratory studies were obtained  they were taken to the operating room on 08/04/2015 and underwent Procedure(s): LEFT TOTAL KNEE ARTHROPLASTY.   They were given perioperative antibiotics:  Anti-infectives    Start     Dose/Rate Route Frequency Ordered Stop   08/04/15 1400  ceFAZolin (ANCEF) IVPB 1 g/50 mL premix     1 g 100 mL/hr over 30 Minutes Intravenous Every 6 hours 08/04/15 0945 08/04/15 2110   08/04/15 0700  ceFAZolin (ANCEF) IVPB 2g/100 mL premix     2 g 200 mL/hr over 30 Minutes Intravenous To ShortStay Surgical 08/01/15 0843 08/04/15 0745   08/04/15 0617  ceFAZolin (ANCEF) 2-4 GM/100ML-% IVPB    Comments:  Ardine Eng   : cabinet override      08/04/15 0617 08/04/15 1829    .  Patient given tranexamic acid IV or topical and exparel  intra-operatively.  Tolerated the procedure well.    POD# 1: Vital signs were stable.  Patient denied Chest pain, shortness of breath, or calf pain.  Patient was started on Lovenox 30 mg subcutaneously twice daily at 8am.  Consults to PT, OT, and care management were made.  The patient was weight bearing as tolerated.  CPM was placed on the operative leg 0-90 degrees for 6-8 hours a day. When out of the CPM, patient was placed in the foam block to achieve full extension. Incentive spirometry was taught.  Dressing was changed.       POD #2, Continued  PT for ambulation and exercise program.  IV saline locked.  O2 discontinued.    The remainder of the hospital course was dedicated to ambulation and strengthening.   The patient was discharged on 1 day post-op in  Good condition.  Blood products given:none  DIAGNOSTIC STUDIES: Recent vital signs: No data found.      Recent laboratory studies:  Recent Labs  08/04/15 1148 08/05/15 0645  WBC 4.7 6.8  HGB 10.3* 9.4*  HCT 32.3* 30.6*  PLT 117* 98*  Recent Labs  08/04/15 1148 08/05/15 0645  NA  --  138  K  --  4.3  CL  --  109  CO2  --  21*  BUN  --  18  CREATININE 1.27* 0.95  GLUCOSE  --  125*  CALCIUM  --  9.0   Lab Results  Component Value Date   INR 1.08 07/30/2015     Recent Radiographic Studies :  Dg Chest 2 View  07/30/2015  CLINICAL DATA:  Preoperative evaluation for knee replacement. History of breast carcinoma EXAM: CHEST  2 VIEW COMPARISON:  Sep 20, 2009 FINDINGS: There is no edema or consolidation. The heart size and pulmonary vascularity are normal. No adenopathy. There is degenerative change in the thoracic spine. There are no blastic or lytic bone lesions. Postoperative change in the left breast region is noted. IMPRESSION: No edema or consolidation.  No adenopathy. Electronically Signed   By: Lowella Grip III M.D.   On: 07/30/2015 13:56    DISCHARGE INSTRUCTIONS: Discharge Instructions    CPM     Complete by:  As directed   Continuous passive motion machine (CPM):      Use the CPM from 0 to 90 for 4-6 hours per day.      You may increase by 10 per day.  You may break it up into 2 or 3 sessions per day.      Use CPM for 2 weeks or until you are told to stop.     Call MD / Call 911    Complete by:  As directed   If you experience chest pain or shortness of breath, CALL 911 and be transported to the hospital emergency room.  If you develope a fever above 101 F, pus (white drainage) or increased drainage or redness at the wound, or calf pain, call your surgeon's office.     Change dressing    Complete by:  As directed   Change dressing on tomorrow, then change the dressing daily with sterile 4 x 4 inch gauze dressing and apply TED hose.  You may clean the incision with alcohol prior to redressing.     Constipation Prevention    Complete by:  As directed   Drink plenty of fluids.  Prune juice may be helpful.  You may use a stool softener, such as Colace (over the counter) 100 mg twice a day.  Use MiraLax (over the counter) for constipation as needed.     Diet - low sodium heart healthy    Complete by:  As directed      Discharge instructions    Complete by:  As directed   INSTRUCTIONS AFTER JOINT REPLACEMENT   Remove items at home which could result in a fall. This includes throw rugs or furniture in walking pathways ICE to the affected joint every three hours while awake for 30 minutes at a time, for at least the first 3-5 days, and then as needed for pain and swelling.  Continue to use ice for pain and swelling. You may notice swelling that will progress down to the foot and ankle.  This is normal after surgery.  Elevate your leg when you are not up walking on it.   Continue to use the breathing machine you got in the hospital (incentive spirometer) which will help keep your temperature down.  It is common for your temperature to cycle up and down following surgery, especially at night  when you are not up moving around  and exerting yourself.  The breathing machine keeps your lungs expanded and your temperature down.   DIET:  As you were doing prior to hospitalization, we recommend a well-balanced diet.  DRESSING / WOUND CARE / SHOWERING  You may change your dressing 3-5 days after surgery.  Then change the dressing every day with sterile gauze.  Please use good hand washing techniques before changing the dressing.  Do not use any lotions or creams on the incision until instructed by your surgeon.  ACTIVITY  Increase activity slowly as tolerated, but follow the weight bearing instructions below.   No driving for 6 weeks or until further direction given by your physician.  You cannot drive while taking narcotics.  No lifting or carrying greater than 10 lbs. until further directed by your surgeon. Avoid periods of inactivity such as sitting longer than an hour when not asleep. This helps prevent blood clots.  You may return to work once you are authorized by your doctor.     WEIGHT BEARING   Weight bearing as tolerated with assist device (walker, cane, etc) as directed, use it as long as suggested by your surgeon or therapist, typically at least 4-6 weeks.   EXERCISES  Results after joint replacement surgery are often greatly improved when you follow the exercise, range of motion and muscle strengthening exercises prescribed by your doctor. Safety measures are also important to protect the joint from further injury. Any time any of these exercises cause you to have increased pain or swelling, decrease what you are doing until you are comfortable again and then slowly increase them. If you have problems or questions, call your caregiver or physical therapist for advice.   Rehabilitation is important following a joint replacement. After just a few days of immobilization, the muscles of the leg can become weakened and shrink (atrophy).  These exercises are designed to build  up the tone and strength of the thigh and leg muscles and to improve motion. Often times heat used for twenty to thirty minutes before working out will loosen up your tissues and help with improving the range of motion but do not use heat for the first two weeks following surgery (sometimes heat can increase post-operative swelling).   These exercises can be done on a training (exercise) mat, on the floor, on a table or on a bed. Use whatever works the best and is most comfortable for you.    Use music or television while you are exercising so that the exercises are a pleasant break in your day. This will make your life better with the exercises acting as a break in your routine that you can look forward to.   Perform all exercises about fifteen times, three times per day or as directed.  You should exercise both the operative leg and the other leg as well.   Exercises include:   Quad Sets - Tighten up the muscle on the front of the thigh (Quad) and hold for 5-10 seconds.   Straight Leg Raises - With your knee straight (if you were given a brace, keep it on), lift the leg to 60 degrees, hold for 3 seconds, and slowly lower the leg.  Perform this exercise against resistance later as your leg gets stronger.  Leg Slides: Lying on your back, slowly slide your foot toward your buttocks, bending your knee up off the floor (only go as far as is comfortable). Then slowly slide your foot back down until your leg is flat on  the floor again.  Angel Wings: Lying on your back spread your legs to the side as far apart as you can without causing discomfort.  Hamstring Strength:  Lying on your back, push your heel against the floor with your leg straight by tightening up the muscles of your buttocks.  Repeat, but this time bend your knee to a comfortable angle, and push your heel against the floor.  You may put a pillow under the heel to make it more comfortable if necessary.   A rehabilitation program following joint  replacement surgery can speed recovery and prevent re-injury in the future due to weakened muscles. Contact your doctor or a physical therapist for more information on knee rehabilitation.    CONSTIPATION  Constipation is defined medically as fewer than three stools per week and severe constipation as less than one stool per week.  Even if you have a regular bowel pattern at home, your normal regimen is likely to be disrupted due to multiple reasons following surgery.  Combination of anesthesia, postoperative narcotics, change in appetite and fluid intake all can affect your bowels.   YOU MUST use at least one of the following options; they are listed in order of increasing strength to get the job done.  They are all available over the counter, and you may need to use some, POSSIBLY even all of these options:    Drink plenty of fluids (prune juice may be helpful) and high fiber foods Colace 100 mg by mouth twice a day  Senokot for constipation as directed and as needed Dulcolax (bisacodyl), take with full glass of water  Miralax (polyethylene glycol) once or twice a day as needed.  If you have tried all these things and are unable to have a bowel movement in the first 3-4 days after surgery call either your surgeon or your primary doctor.    If you experience loose stools or diarrhea, hold the medications until you stool forms back up.  If your symptoms do not get better within 1 week or if they get worse, check with your doctor.  If you experience "the worst abdominal pain ever" or develop nausea or vomiting, please contact the office immediately for further recommendations for treatment.   ITCHING:  If you experience itching with your medications, try taking only a single pain pill, or even half a pain pill at a time.  You can also use Benadryl over the counter for itching or also to help with sleep.   TED HOSE STOCKINGS:  Use stockings on both legs until for at least 2 weeks or as directed by  physician office. They may be removed at night for sleeping.  MEDICATIONS:  See your medication summary on the "After Visit Summary" that nursing will review with you.  You may have some home medications which will be placed on hold until you complete the course of blood thinner medication.  It is important for you to complete the blood thinner medication as prescribed.  PRECAUTIONS:  If you experience chest pain or shortness of breath - call 911 immediately for transfer to the hospital emergency department.   If you develop a fever greater that 101 F, purulent drainage from wound, increased redness or drainage from wound, foul odor from the wound/dressing, or calf pain - CONTACT YOUR SURGEON.  FOLLOW-UP APPOINTMENTS:  If you do not already have a post-op appointment, please call the office for an appointment to be seen by your surgeon.  Guidelines for how soon to be seen are listed in your "After Visit Summary", but are typically between 1-4 weeks after surgery.  OTHER INSTRUCTIONS:   Knee Replacement:  Do not place pillow under knee, focus on keeping the knee straight while resting. CPM instructions: 0-90 degrees, 2 hours in the morning, 2 hours in the afternoon, and 2 hours in the evening. Place foam block, curve side up under heel at all times except when in CPM or when walking.  DO NOT modify, tear, cut, or change the foam block in any way.  MAKE SURE YOU:  Understand these instructions.  Get help right away if you are not doing well or get worse.    Thank you for letting us be a part of your medical care team.  It is a privilege we respect greatly.  We hope these instructions will help you stay on track for a fast and full recovery!     Driving restrictions    Complete by:  As directed   No driving for at least 2 weeks     Increase activity slowly as tolerated    Complete by:  As directed            DISCHARGE MEDICATIONS:      Medication List    STOP taking these medications        HYDROcodone-acetaminophen 5-325 MG tablet  Commonly known as:  NORCO/VICODIN     ibuprofen 600 MG tablet  Commonly known as:  ADVIL,MOTRIN     magnesium oxide 400 MG tablet  Commonly known as:  MAG-OX     meloxicam 15 MG tablet  Commonly known as:  MOBIC     oxyCODONE-acetaminophen 5-325 MG tablet  Commonly known as:  ROXICET     traMADol 50 MG tablet  Commonly known as:  ULTRAM      TAKE these medications        aspirin 81 MG tablet  Take 81 mg by mouth daily.     Biotin 5000 MCG Tabs  Take 1 tablet by mouth daily.     Cinnamon 500 MG capsule  Take 500 mg by mouth daily.     clonazePAM 0.5 MG tablet  Commonly known as:  KLONOPIN  Take 0.5 mg by mouth 2 (two) times daily as needed.     Coconut Oil 1000 MG Caps  Take 1 capsule by mouth daily.     Cranberry Extract 250 MG Tabs  Take 1 capsule by mouth daily.     DHEA 25 MG Caps  Take by mouth.     enoxaparin 40 MG/0.4ML injection  Commonly known as:  LOVENOX  Inject 0.4 mLs (40 mg total) into the skin daily.     etodolac 400 MG tablet  Commonly known as:  LODINE  Take 400 mg by mouth 2 (two) times daily.     letrozole 2.5 MG tablet  Commonly known as:  FEMARA  Take 2.5 mg by mouth daily.     magnesium gluconate 500 MG tablet  Commonly known as:  MAGONATE  Take 250 mg by mouth 2 (two) times daily.     methocarbamol 500 MG tablet  Commonly known as:  ROBAXIN  Take 1-2 tablets (500-1,000 mg total) by mouth every 6 (six) hours as needed for muscle spasms.     nortriptyline 10 MG  capsule  Commonly known as:  PAMELOR  Take 10 mg by mouth at bedtime.     ondansetron 4 MG tablet  Commonly known as:  ZOFRAN  Take 1 tablet (4 mg total) by mouth every 6 (six) hours as needed for nausea.     oxyCODONE 5 MG immediate release tablet  Commonly known as:  Oxy IR/ROXICODONE  Take 1-2 tablets (5-10 mg total) by mouth every 3 (three) hours as needed for  breakthrough pain.     oxyCODONE 10 mg 12 hr tablet  Commonly known as:  OXYCONTIN  Take 1 tablet (10 mg total) by mouth every 12 (twelve) hours.     rizatriptan 10 MG tablet  Commonly known as:  MAXALT  Take 10 mg by mouth as needed. May repeat in 2 hours if needed     rOPINIRole 3 MG tablet  Commonly known as:  REQUIP  Take 1.5-3 mg by mouth at bedtime.     tamoxifen 20 MG tablet  Commonly known as:  NOLVADEX  Take 20 mg by mouth daily. Patient to complete Oct 2017     TOVIAZ 4 MG Tb24 tablet  Generic drug:  fesoterodine  Take 4 mg by mouth daily.     venlafaxine 100 MG tablet  Commonly known as:  EFFEXOR  Take 100 mg by mouth 2 (two) times daily.     VISION-VITE PRESERVE PO  Take by mouth 2 (two) times daily.     vitamin B-12 1000 MCG tablet  Commonly known as:  CYANOCOBALAMIN  Take 1,000 mcg by mouth daily.     vitamin C 100 MG tablet  Take 100 mg by mouth daily.     vitamin E 400 UNIT capsule  Take 400 Units by mouth daily.     zinc gluconate 50 MG tablet  Take 50 mg by mouth daily.        FOLLOW UP VISIT:       Follow-up Information    Follow up with Rudean Haskell, MD. Call on 08/19/2015.   Specialty:  Orthopedic Surgery   Contact information:   [place] [address] [phone]       Follow up with [facility].   Why:  They will contact you to schedule home therapy visits.   Contact information:   [address] [place] [address] [phone]       DISPOSITION: HOME VS. SNF  CONDITION:  Good   Donia Ast 08/11/2015, 6:43 AM

## 2015-09-11 ENCOUNTER — Other Ambulatory Visit: Payer: Self-pay | Admitting: Orthopedic Surgery

## 2015-10-29 ENCOUNTER — Encounter (HOSPITAL_COMMUNITY)
Admission: RE | Admit: 2015-10-29 | Discharge: 2015-10-29 | Disposition: A | Discharge status: Home / Self Care | Payer: Medicare Other | Source: Ambulatory Visit | Attending: Orthopedic Surgery | Admitting: Orthopedic Surgery

## 2015-10-29 ENCOUNTER — Encounter (HOSPITAL_COMMUNITY): Payer: Self-pay

## 2015-10-29 ENCOUNTER — Other Ambulatory Visit (HOSPITAL_COMMUNITY): Payer: Self-pay | Admitting: *Deleted

## 2015-10-29 DIAGNOSIS — Z01812 Encounter for preprocedural laboratory examination: Secondary | ICD-10-CM | POA: Insufficient documentation

## 2015-10-29 DIAGNOSIS — M1711 Unilateral primary osteoarthritis, right knee: Secondary | ICD-10-CM | POA: Diagnosis not present

## 2015-10-29 HISTORY — DX: Gastro-esophageal reflux disease without esophagitis: K21.9

## 2015-10-29 HISTORY — DX: Stress incontinence (female) (male): N39.3

## 2015-10-29 LAB — URINALYSIS, ROUTINE W REFLEX MICROSCOPIC
BILIRUBIN URINE: NEGATIVE
Glucose, UA: NEGATIVE mg/dL
HGB URINE DIPSTICK: NEGATIVE
KETONES UR: NEGATIVE mg/dL
Leukocytes, UA: NEGATIVE
NITRITE: NEGATIVE
Protein, ur: NEGATIVE mg/dL
Specific Gravity, Urine: 1.026 (ref 1.005–1.030)
pH: 5.5 (ref 5.0–8.0)

## 2015-10-29 LAB — COMPREHENSIVE METABOLIC PANEL
ALBUMIN: 3.3 g/dL — AB (ref 3.5–5.0)
ALT: 14 U/L (ref 14–54)
ANION GAP: 4 — AB (ref 5–15)
AST: 16 U/L (ref 15–41)
Alkaline Phosphatase: 49 U/L (ref 38–126)
BILIRUBIN TOTAL: 0.3 mg/dL (ref 0.3–1.2)
BUN: 25 mg/dL — ABNORMAL HIGH (ref 6–20)
CALCIUM: 10.1 mg/dL (ref 8.9–10.3)
CO2: 23 mmol/L (ref 22–32)
Chloride: 110 mmol/L (ref 101–111)
Creatinine, Ser: 0.97 mg/dL (ref 0.44–1.00)
GFR, EST NON AFRICAN AMERICAN: 55 mL/min — AB (ref >60)
Glucose, Bld: 97 mg/dL (ref 65–99)
POTASSIUM: 4.1 mmol/L (ref 3.5–5.1)
Sodium: 137 mmol/L (ref 135–145)
TOTAL PROTEIN: 6.4 g/dL — AB (ref 6.5–8.1)

## 2015-10-29 LAB — CBC WITH DIFFERENTIAL/PLATELET
BASOS PCT: 0 %
Basophils Absolute: 0 10*3/uL (ref 0.0–0.1)
Eosinophils Absolute: 0.1 10*3/uL (ref 0.0–0.7)
Eosinophils Relative: 3 %
HEMATOCRIT: 34.6 % — AB (ref 36.0–46.0)
Hemoglobin: 10.8 g/dL — ABNORMAL LOW (ref 12.0–15.0)
LYMPHS ABS: 1.6 10*3/uL (ref 0.7–4.0)
Lymphocytes Relative: 32 %
MCH: 29.4 pg (ref 26.0–34.0)
MCHC: 31.2 g/dL (ref 30.0–36.0)
MCV: 94.3 fL (ref 78.0–100.0)
MONO ABS: 0.3 10*3/uL (ref 0.1–1.0)
MONOS PCT: 6 %
NEUTROS ABS: 3 10*3/uL (ref 1.7–7.7)
Neutrophils Relative %: 59 %
Platelets: 135 10*3/uL — ABNORMAL LOW (ref 150–400)
RBC: 3.67 MIL/uL — ABNORMAL LOW (ref 3.87–5.11)
RDW: 15 % (ref 11.5–15.5)
WBC: 5 10*3/uL (ref 4.0–10.5)

## 2015-10-29 LAB — SURGICAL PCR SCREEN
MRSA, PCR: NEGATIVE
STAPHYLOCOCCUS AUREUS: NEGATIVE

## 2015-10-29 LAB — APTT: aPTT: 31 seconds (ref 24–37)

## 2015-10-29 LAB — PROTIME-INR
INR: 1.13 (ref 0.00–1.49)
PROTHROMBIN TIME: 14.7 s (ref 11.6–15.2)

## 2015-10-29 NOTE — Pre-Procedure Instructions (Addendum)
Whitney Campbell  10/29/2015      [id number] - [place], [place] [address] [address] Phone: [phone] Fax: [phone]    Your procedure is scheduled on Monday, November 10, 2015              Report to [facility] Entrance "A" Admitting Office at 5:30 AM.  Call this number if you have problems the morning of surgery: [phone]  Any questions prior to day of surgery, please call [phone] between 8 & 4 PM.   Remember:  Do not eat food or drink liquids after midnight Sunday, 11/09/15.  Take these medicines the morning of surgery with A SIP OF WATER: Tamoxifen (Nolvadex), Hydrocodone - if needed  Stop all vitamins,and Herbal medications(cranberry,coconut oil,cinnamon, biotin,magnesium,B-12,zinc, dhea.C,E) 7 days prior to surgery.11/03/15  Do not use Aspirin products or NSAIDS (Aleve, Ibuprofen, etc.) 7 days prior to surgery.   Do not wear jewelry, make-up or nail polish.  Do not wear lotions, powders, or perfumes.  You may wear deoderant.  Do not shave 48 hours prior to surgery.    Do not bring valuables to the hospital.  [facility] is not responsible for any belongings or valuables.  Contacts, dentures or bridgework may not be worn into surgery.  Leave your suitcase in the car.  After surgery it may be brought to your room.  For patients admitted to the hospital, discharge time will be determined by your treatment team.  Special instructions:  [facility] - Preparing for Surgery  Before surgery, you can play an important role.  Because skin is not sterile, your skin needs to be as free of germs as possible.  You can reduce the number of germs on you skin by washing with CHG (chlorahexidine gluconate) soap before surgery.  CHG is an antiseptic cleaner which kills germs and bonds with the skin to continue killing germs even after washing.  Please DO NOT use if you have an allergy to CHG or antibacterial  soaps.  If your skin becomes reddened/irritated stop using the CHG and inform your nurse when you arrive at Short Stay.  Do not shave (including legs and underarms) for at least 48 hours prior to the first CHG shower.  You may shave your face.  Please follow these instructions carefully:   1.  Shower with CHG Soap the night before surgery and the                                morning of Surgery.  2.  If you choose to wash your hair, wash your hair first as usual with your       normal shampoo.  3.  After you shampoo, rinse your hair and body thoroughly to remove the                      Shampoo.  4.  Use CHG as you would any other liquid soap.  You can apply chg directly       to the skin and wash gently with scrungie or a clean washcloth.  5.  Apply the CHG Soap to your body ONLY FROM THE NECK DOWN.        Do not use on open wounds or open sores.  Avoid contact with your eyes, ears, mouth and genitals (private parts).  Wash genitals (private parts) with your normal soap.  6.  Wash thoroughly, paying special attention to the area where your surgery        will be performed.  7.  Thoroughly rinse your body with warm water from the neck down.  8.  DO NOT shower/wash with your normal soap after using and rinsing off       the CHG Soap.  9.  Pat yourself dry with a clean towel.            10.  Wear clean pajamas.            11.  Place clean sheets on your bed the night of your first shower and do not        sleep with pets.  Day of Surgery  Do not apply any lotions the morning of surgery.  Please wear clean clothes to the hospital.   Please read over the following fact sheets that you were given. Pain Booklet, Coughing and Deep Breathing, MRSA Information and Surgical Site Infection Prevention

## 2015-10-30 LAB — URINE CULTURE

## 2015-11-07 MED ORDER — CEFAZOLIN SODIUM-DEXTROSE 2-4 GM/100ML-% IV SOLN
2.0000 g | INTRAVENOUS | Status: AC
  Administered 2015-11-10: 2 g via INTRAVENOUS
  Filled 2015-11-07: qty 100

## 2015-11-07 MED ORDER — BUPIVACAINE LIPOSOME 1.3 % IJ SUSP
20.0000 mL | INTRAMUSCULAR | Status: AC
  Administered 2015-11-10: 20 mL
  Filled 2015-11-07: qty 20

## 2015-11-07 MED ORDER — TRANEXAMIC ACID 1000 MG/10ML IV SOLN
1000.0000 mg | INTRAVENOUS | Status: AC
  Administered 2015-11-10: 1000 mg via INTRAVENOUS
  Filled 2015-11-07: qty 10

## 2015-11-07 MED ORDER — SODIUM CHLORIDE 0.9 % IV SOLN: INTRAVENOUS | Status: DC

## 2015-11-07 MED ORDER — ACETAMINOPHEN 500 MG PO TABS
1000.0000 mg | ORAL_TABLET | Freq: Once | ORAL | Status: AC
  Administered 2015-11-10: 1000 mg via ORAL
  Filled 2015-11-07: qty 2

## 2015-11-10 ENCOUNTER — Inpatient Hospital Stay (HOSPITAL_COMMUNITY): Payer: Medicare Other | Admitting: Emergency Medicine

## 2015-11-10 ENCOUNTER — Encounter (HOSPITAL_COMMUNITY)
Admission: RE | Disposition: A | Discharge status: Home Health | Payer: Self-pay | Source: Ambulatory Visit | Attending: Orthopedic Surgery

## 2015-11-10 ENCOUNTER — Inpatient Hospital Stay (HOSPITAL_COMMUNITY)
Admission: RE | Admit: 2015-11-10 | Discharge: 2015-11-11 | DRG: 470 | Disposition: A | Discharge status: Home Health | Payer: Medicare Other | Source: Ambulatory Visit | Attending: Orthopedic Surgery | Admitting: Orthopedic Surgery

## 2015-11-10 ENCOUNTER — Encounter (HOSPITAL_COMMUNITY): Payer: Self-pay | Admitting: Anesthesiology

## 2015-11-10 DIAGNOSIS — Z79899 Other long term (current) drug therapy: Secondary | ICD-10-CM | POA: Diagnosis not present

## 2015-11-10 DIAGNOSIS — Z96652 Presence of left artificial knee joint: Secondary | ICD-10-CM | POA: Diagnosis present

## 2015-11-10 DIAGNOSIS — Z885 Allergy status to narcotic agent status: Secondary | ICD-10-CM | POA: Diagnosis not present

## 2015-11-10 DIAGNOSIS — N393 Stress incontinence (female) (male): Secondary | ICD-10-CM | POA: Diagnosis present

## 2015-11-10 DIAGNOSIS — D62 Acute posthemorrhagic anemia: Secondary | ICD-10-CM | POA: Diagnosis present

## 2015-11-10 DIAGNOSIS — M25561 Pain in right knee: Secondary | ICD-10-CM | POA: Diagnosis present

## 2015-11-10 DIAGNOSIS — M1711 Unilateral primary osteoarthritis, right knee: Secondary | ICD-10-CM | POA: Diagnosis present

## 2015-11-10 DIAGNOSIS — K219 Gastro-esophageal reflux disease without esophagitis: Secondary | ICD-10-CM | POA: Diagnosis present

## 2015-11-10 DIAGNOSIS — Z888 Allergy status to other drugs, medicaments and biological substances status: Secondary | ICD-10-CM

## 2015-11-10 DIAGNOSIS — E7800 Pure hypercholesterolemia, unspecified: Secondary | ICD-10-CM | POA: Diagnosis present

## 2015-11-10 DIAGNOSIS — Z96659 Presence of unspecified artificial knee joint: Secondary | ICD-10-CM

## 2015-11-10 DIAGNOSIS — Z853 Personal history of malignant neoplasm of breast: Secondary | ICD-10-CM

## 2015-11-10 HISTORY — PX: TOTAL KNEE ARTHROPLASTY: SHX125

## 2015-11-10 HISTORY — DX: Presence of unspecified artificial knee joint: Z96.659

## 2015-11-10 LAB — TYPE AND SCREEN
ABO/RH(D): A POS
Antibody Screen: NEGATIVE

## 2015-11-10 SURGERY — ARTHROPLASTY, KNEE, TOTAL
Anesthesia: Monitor Anesthesia Care | Site: Knee | Laterality: Right

## 2015-11-10 MED ORDER — BUPIVACAINE-EPINEPHRINE (PF) 0.25% -1:200000 IJ SOLN
INTRAMUSCULAR | Status: DC | PRN
  Administered 2015-11-10: 30 mL

## 2015-11-10 MED ORDER — ONDANSETRON HCL 4 MG PO TABS: 4.0000 mg | ORAL_TABLET | Freq: Four times a day (QID) | ORAL | Status: DC | PRN

## 2015-11-10 MED ORDER — TAMOXIFEN CITRATE 10 MG PO TABS
20.0000 mg | ORAL_TABLET | Freq: Every day | ORAL | Status: DC
  Administered 2015-11-11: 20 mg via ORAL
  Filled 2015-11-10: qty 2

## 2015-11-10 MED ORDER — METHOCARBAMOL 1000 MG/10ML IJ SOLN
500.0000 mg | Freq: Four times a day (QID) | INTRAVENOUS | Status: DC | PRN
  Filled 2015-11-10: qty 5

## 2015-11-10 MED ORDER — ALUM & MAG HYDROXIDE-SIMETH 200-200-20 MG/5ML PO SUSP: 30.0000 mL | ORAL | Status: DC | PRN

## 2015-11-10 MED ORDER — ONDANSETRON HCL 4 MG/2ML IJ SOLN: 4.0000 mg | Freq: Four times a day (QID) | INTRAMUSCULAR | Status: DC | PRN

## 2015-11-10 MED ORDER — DIPHENHYDRAMINE HCL 12.5 MG/5ML PO ELIX: 12.5000 mg | ORAL_SOLUTION | ORAL | Status: DC | PRN

## 2015-11-10 MED ORDER — DEXAMETHASONE SODIUM PHOSPHATE 10 MG/ML IJ SOLN
10.0000 mg | Freq: Once | INTRAMUSCULAR | Status: AC
  Administered 2015-11-11: 10 mg via INTRAVENOUS
  Filled 2015-11-10: qty 1

## 2015-11-10 MED ORDER — PHENYLEPHRINE HCL 10 MG/ML IJ SOLN
INTRAMUSCULAR | Status: DC | PRN
  Administered 2015-11-10 (×4): 80 ug via INTRAVENOUS

## 2015-11-10 MED ORDER — METOCLOPRAMIDE HCL 5 MG PO TABS: 5.0000 mg | ORAL_TABLET | Freq: Three times a day (TID) | ORAL | Status: DC | PRN

## 2015-11-10 MED ORDER — PHENOL 1.4 % MT LIQD: 1.0000 | OROMUCOSAL | Status: DC | PRN

## 2015-11-10 MED ORDER — PROPOFOL 10 MG/ML IV BOLUS
INTRAVENOUS | Status: AC
  Filled 2015-11-10: qty 40

## 2015-11-10 MED ORDER — ONDANSETRON HCL 4 MG/2ML IJ SOLN
INTRAMUSCULAR | Status: DC | PRN
  Administered 2015-11-10: 4 mg via INTRAVENOUS

## 2015-11-10 MED ORDER — CHLORHEXIDINE GLUCONATE 4 % EX LIQD: 60.0000 mL | Freq: Once | CUTANEOUS | Status: DC

## 2015-11-10 MED ORDER — OXYCODONE HCL 5 MG/5ML PO SOLN: 5.0000 mg | Freq: Once | ORAL | Status: DC | PRN

## 2015-11-10 MED ORDER — ACETAMINOPHEN 325 MG PO TABS: 650.0000 mg | ORAL_TABLET | Freq: Four times a day (QID) | ORAL | Status: DC | PRN

## 2015-11-10 MED ORDER — FENTANYL CITRATE (PF) 100 MCG/2ML IJ SOLN
INTRAMUSCULAR | Status: DC | PRN
Start: 2015-11-10 — End: 2015-11-10
  Administered 2015-11-10 (×2): 50 ug via INTRAVENOUS

## 2015-11-10 MED ORDER — FLEET ENEMA 7-19 GM/118ML RE ENEM: 1.0000 | ENEMA | Freq: Once | RECTAL | Status: DC | PRN

## 2015-11-10 MED ORDER — HYDROMORPHONE HCL 1 MG/ML IJ SOLN: 0.2500 mg | INTRAMUSCULAR | Status: DC | PRN

## 2015-11-10 MED ORDER — BUPIVACAINE IN DEXTROSE 0.75-8.25 % IT SOLN
INTRATHECAL | Status: DC | PRN
  Administered 2015-11-10: 1.8 mL via INTRATHECAL

## 2015-11-10 MED ORDER — MIDAZOLAM HCL 5 MG/5ML IJ SOLN
INTRAMUSCULAR | Status: DC | PRN
  Administered 2015-11-10 (×2): 1 mg via INTRAVENOUS

## 2015-11-10 MED ORDER — LACTATED RINGERS IV SOLN
INTRAVENOUS | Status: DC | PRN
  Administered 2015-11-10 (×2): via INTRAVENOUS

## 2015-11-10 MED ORDER — MIDAZOLAM HCL 2 MG/2ML IJ SOLN
INTRAMUSCULAR | Status: AC
  Filled 2015-11-10: qty 2

## 2015-11-10 MED ORDER — ONDANSETRON HCL 4 MG/2ML IJ SOLN
INTRAMUSCULAR | Status: AC
  Filled 2015-11-10: qty 2

## 2015-11-10 MED ORDER — ASPIRIN EC 325 MG PO TBEC
325.0000 mg | DELAYED_RELEASE_TABLET | Freq: Two times a day (BID) | ORAL | Status: DC
  Administered 2015-11-10 – 2015-11-11 (×3): 325 mg via ORAL
  Filled 2015-11-10 (×3): qty 1

## 2015-11-10 MED ORDER — ACETAMINOPHEN 650 MG RE SUPP: 650.0000 mg | Freq: Four times a day (QID) | RECTAL | Status: DC | PRN

## 2015-11-10 MED ORDER — PROPOFOL 500 MG/50ML IV EMUL
INTRAVENOUS | Status: DC | PRN
  Administered 2015-11-10: 100 ug/kg/min via INTRAVENOUS

## 2015-11-10 MED ORDER — SODIUM CHLORIDE 0.9 % IJ SOLN
INTRAMUSCULAR | Status: DC | PRN
  Administered 2015-11-10: 10 mL

## 2015-11-10 MED ORDER — 0.9 % SODIUM CHLORIDE (POUR BTL) OPTIME
TOPICAL | Status: DC | PRN
  Administered 2015-11-10: 1000 mL

## 2015-11-10 MED ORDER — TRANEXAMIC ACID 1000 MG/10ML IV SOLN
1000.0000 mg | Freq: Once | INTRAVENOUS | Status: AC
  Administered 2015-11-10: 1000 mg via INTRAVENOUS
  Filled 2015-11-10: qty 10

## 2015-11-10 MED ORDER — SENNOSIDES-DOCUSATE SODIUM 8.6-50 MG PO TABS: 1.0000 | ORAL_TABLET | Freq: Every evening | ORAL | Status: DC | PRN

## 2015-11-10 MED ORDER — FENTANYL CITRATE (PF) 250 MCG/5ML IJ SOLN
INTRAMUSCULAR | Status: AC
  Filled 2015-11-10: qty 5

## 2015-11-10 MED ORDER — OXYCODONE HCL ER 10 MG PO T12A
10.0000 mg | EXTENDED_RELEASE_TABLET | Freq: Two times a day (BID) | ORAL | Status: DC
  Administered 2015-11-10 – 2015-11-11 (×3): 10 mg via ORAL
  Filled 2015-11-10 (×3): qty 1

## 2015-11-10 MED ORDER — BUPIVACAINE-EPINEPHRINE (PF) 0.25% -1:200000 IJ SOLN
INTRAMUSCULAR | Status: AC
  Filled 2015-11-10: qty 30

## 2015-11-10 MED ORDER — SODIUM CHLORIDE 0.9 % IV SOLN: INTRAVENOUS | Status: DC

## 2015-11-10 MED ORDER — DOCUSATE SODIUM 100 MG PO CAPS
100.0000 mg | ORAL_CAPSULE | Freq: Two times a day (BID) | ORAL | Status: DC
  Administered 2015-11-10 – 2015-11-11 (×3): 100 mg via ORAL
  Filled 2015-11-10 (×3): qty 1

## 2015-11-10 MED ORDER — ACETAMINOPHEN 500 MG PO TABS
1000.0000 mg | ORAL_TABLET | Freq: Four times a day (QID) | ORAL | Status: DC
  Administered 2015-11-10 – 2015-11-11 (×3): 1000 mg via ORAL
  Filled 2015-11-10 (×3): qty 2

## 2015-11-10 MED ORDER — PROPOFOL 10 MG/ML IV BOLUS
INTRAVENOUS | Status: AC
  Filled 2015-11-10: qty 20

## 2015-11-10 MED ORDER — HYDROMORPHONE HCL 1 MG/ML IJ SOLN
1.0000 mg | INTRAMUSCULAR | Status: DC | PRN
  Administered 2015-11-10 – 2015-11-11 (×3): 1 mg via INTRAVENOUS
  Filled 2015-11-10 (×3): qty 1

## 2015-11-10 MED ORDER — OXYCODONE HCL 5 MG PO TABS: 5.0000 mg | ORAL_TABLET | Freq: Once | ORAL | Status: DC | PRN

## 2015-11-10 MED ORDER — METOCLOPRAMIDE HCL 5 MG/ML IJ SOLN: 5.0000 mg | Freq: Three times a day (TID) | INTRAMUSCULAR | Status: DC | PRN

## 2015-11-10 MED ORDER — METHOCARBAMOL 500 MG PO TABS
500.0000 mg | ORAL_TABLET | Freq: Four times a day (QID) | ORAL | Status: DC | PRN
  Administered 2015-11-10 – 2015-11-11 (×3): 500 mg via ORAL
  Filled 2015-11-10 (×3): qty 1

## 2015-11-10 MED ORDER — PROPOFOL 10 MG/ML IV BOLUS
INTRAVENOUS | Status: DC | PRN
  Administered 2015-11-10 (×2): 20 mg via INTRAVENOUS

## 2015-11-10 MED ORDER — MENTHOL 3 MG MT LOZG
1.0000 | LOZENGE | OROMUCOSAL | Status: DC | PRN
Start: 2015-11-10 — End: 2015-11-11

## 2015-11-10 MED ORDER — BISACODYL 5 MG PO TBEC: 5.0000 mg | DELAYED_RELEASE_TABLET | Freq: Every day | ORAL | Status: DC | PRN

## 2015-11-10 MED ORDER — ZOLPIDEM TARTRATE 5 MG PO TABS
5.0000 mg | ORAL_TABLET | Freq: Every evening | ORAL | Status: DC | PRN
  Administered 2015-11-10: 5 mg via ORAL
  Filled 2015-11-10: qty 1

## 2015-11-10 MED ORDER — CEFAZOLIN IN D5W 1 GM/50ML IV SOLN
1.0000 g | Freq: Four times a day (QID) | INTRAVENOUS | Status: AC
  Administered 2015-11-10 (×2): 1 g via INTRAVENOUS
  Filled 2015-11-10 (×2): qty 50

## 2015-11-10 MED ORDER — SODIUM CHLORIDE 0.9 % IR SOLN
Status: DC | PRN
  Administered 2015-11-10: 1000 mL

## 2015-11-10 MED ORDER — OXYCODONE HCL 5 MG PO TABS
5.0000 mg | ORAL_TABLET | ORAL | Status: DC | PRN
  Administered 2015-11-10 – 2015-11-11 (×3): 10 mg via ORAL
  Administered 2015-11-11: 5 mg via ORAL
  Filled 2015-11-10: qty 2
  Filled 2015-11-10: qty 1
  Filled 2015-11-10 (×2): qty 2

## 2015-11-10 MED ORDER — DHEA 25 MG PO CAPS: 25.0000 mg | ORAL_CAPSULE | Freq: Every day | ORAL | Status: DC

## 2015-11-10 SURGICAL SUPPLY — 65 items
BANDAGE ESMARK 6X9 LF (GAUZE/BANDAGES/DRESSINGS) ×1 IMPLANT
BLADE SAGITTAL 13X1.27X60 (BLADE) ×2 IMPLANT
BLADE SAGITTAL 13X1.27X60MM (BLADE) ×1
BLADE SAW SGTL 83.5X18.5 (BLADE) ×3 IMPLANT
BLADE SURG 10 STRL SS (BLADE) ×3 IMPLANT
BNDG CMPR 9X6 STRL LF SNTH (GAUZE/BANDAGES/DRESSINGS) ×1
BNDG CMPR MED 10X6 ELC LF (GAUZE/BANDAGES/DRESSINGS) ×1
BNDG ELASTIC 6X10 VLCR STRL LF (GAUZE/BANDAGES/DRESSINGS) ×3 IMPLANT
BNDG ESMARK 6X9 LF (GAUZE/BANDAGES/DRESSINGS) ×3
BOWL SMART MIX CTS (DISPOSABLE) ×3 IMPLANT
CAPT KNEE TOTAL 3 ×3 IMPLANT
CEMENT BONE SIMPLEX SPEEDSET (Cement) ×6 IMPLANT
CLOSURE STERI-STRIP 1/2X4 (GAUZE/BANDAGES/DRESSINGS) ×1
CLSR STERI-STRIP ANTIMIC 1/2X4 (GAUZE/BANDAGES/DRESSINGS) ×2 IMPLANT
COVER SURGICAL LIGHT HANDLE (MISCELLANEOUS) ×3 IMPLANT
CUFF TOURNIQUET SINGLE 34IN LL (TOURNIQUET CUFF) ×3 IMPLANT
DRAPE EXTREMITY T 121X128X90 (DRAPE) ×3 IMPLANT
DRAPE INCISE IOBAN 66X45 STRL (DRAPES) ×6 IMPLANT
DRAPE PROXIMA HALF (DRAPES) IMPLANT
DRAPE U-SHAPE 47X51 STRL (DRAPES) ×3 IMPLANT
DRSG ADAPTIC 3X8 NADH LF (GAUZE/BANDAGES/DRESSINGS) ×3 IMPLANT
DRSG AQUACEL AG ADV 3.5X14 (GAUZE/BANDAGES/DRESSINGS) ×3 IMPLANT
DRSG PAD ABDOMINAL 8X10 ST (GAUZE/BANDAGES/DRESSINGS) ×3 IMPLANT
DURAPREP 26ML APPLICATOR (WOUND CARE) ×6 IMPLANT
ELECT REM PT RETURN 9FT ADLT (ELECTROSURGICAL) ×3
ELECTRODE REM PT RTRN 9FT ADLT (ELECTROSURGICAL) ×1 IMPLANT
GAUZE SPONGE 4X4 12PLY STRL (GAUZE/BANDAGES/DRESSINGS) ×3 IMPLANT
GLOVE BIOGEL M 7.0 STRL (GLOVE) IMPLANT
GLOVE BIOGEL PI IND STRL 7.5 (GLOVE) IMPLANT
GLOVE BIOGEL PI IND STRL 8.5 (GLOVE) ×5 IMPLANT
GLOVE BIOGEL PI INDICATOR 7.5 (GLOVE)
GLOVE BIOGEL PI INDICATOR 8.5 (GLOVE) ×10
GLOVE SURG ORTHO 8.0 STRL STRW (GLOVE) ×18 IMPLANT
GOWN STRL REUS W/ TWL LRG LVL3 (GOWN DISPOSABLE) ×1 IMPLANT
GOWN STRL REUS W/ TWL XL LVL3 (GOWN DISPOSABLE) ×2 IMPLANT
GOWN STRL REUS W/TWL 2XL LVL3 (GOWN DISPOSABLE) ×3 IMPLANT
GOWN STRL REUS W/TWL LRG LVL3 (GOWN DISPOSABLE) ×3
GOWN STRL REUS W/TWL XL LVL3 (GOWN DISPOSABLE) ×6
HANDPIECE INTERPULSE COAX TIP (DISPOSABLE) ×3
HOOD PEEL AWAY FACE SHEILD DIS (HOOD) ×12 IMPLANT
KIT BASIN OR (CUSTOM PROCEDURE TRAY) ×3 IMPLANT
KIT ROOM TURNOVER OR (KITS) ×3 IMPLANT
KNEE CAPITATED TOTAL 3 ×1 IMPLANT
MANIFOLD NEPTUNE II (INSTRUMENTS) ×3 IMPLANT
NEEDLE 22X1 1/2 (OR ONLY) (NEEDLE) ×6 IMPLANT
NS IRRIG 1000ML POUR BTL (IV SOLUTION) ×3 IMPLANT
PACK TOTAL JOINT (CUSTOM PROCEDURE TRAY) ×3 IMPLANT
PACK UNIVERSAL I (CUSTOM PROCEDURE TRAY) IMPLANT
PAD ARMBOARD 7.5X6 YLW CONV (MISCELLANEOUS) ×6 IMPLANT
PADDING CAST COTTON 6X4 STRL (CAST SUPPLIES) ×3 IMPLANT
SET HNDPC FAN SPRY TIP SCT (DISPOSABLE) ×1 IMPLANT
STAPLER VISISTAT 35W (STAPLE) IMPLANT
SUCTION FRAZIER HANDLE 10FR (MISCELLANEOUS) ×2
SUCTION TUBE FRAZIER 10FR DISP (MISCELLANEOUS) ×1 IMPLANT
SUT BONE WAX W31G (SUTURE) ×3 IMPLANT
SUT MNCRL AB 3-0 PS2 18 (SUTURE) ×3 IMPLANT
SUT VIC AB 0 CTB1 27 (SUTURE) ×6 IMPLANT
SUT VIC AB 1 CT1 27 (SUTURE) ×6
SUT VIC AB 1 CT1 27XBRD ANBCTR (SUTURE) ×2 IMPLANT
SUT VIC AB 2-0 CT1 27 (SUTURE) ×6
SUT VIC AB 2-0 CT1 TAPERPNT 27 (SUTURE) ×2 IMPLANT
SYR 20CC LL (SYRINGE) ×6 IMPLANT
TOWEL OR 17X24 6PK STRL BLUE (TOWEL DISPOSABLE) ×3 IMPLANT
TOWEL OR 17X26 10 PK STRL BLUE (TOWEL DISPOSABLE) ×3 IMPLANT
WATER STERILE IRR 1000ML POUR (IV SOLUTION) ×6 IMPLANT

## 2015-11-10 NOTE — H&P (Signed)
Whitney Campbell MRN:  [id number] DOB/SEX:  1935/10/28/female  CHIEF COMPLAINT:  Painful right Knee  HISTORY: Patient is a 80 y.o. female presented with a history of pain in the right knee. Onset of symptoms was gradual starting a few years ago with gradually worsening course since that time. Patient has been treated conservatively with over-the-counter NSAIDs and activity modification. Patient currently rates pain in the knee at 10 out of 10 with activity. There is pain at night.  PAST MEDICAL HISTORY: Patient Active Problem List   Diagnosis Date Noted  . S/P total knee arthroplasty 08/04/2015   Past Medical History  Diagnosis Date  . Elevated cholesterol   . Pneumothorax     congential  defect x 3 surgery corrected in 1976  . Migraine   . Breast cancer ([place])   . DDD (degenerative disc disease), lumbar   . Sciatic pain   . Headache     occ migraines  . Arthritis   . Cancer [facility]) 2012    breast left  . GERD (gastroesophageal reflux disease)     hx in past  . Stress bladder incontinence, female    Past Surgical History  Procedure Laterality Date  . Lung surgery      1976  . Back surgery    . Cataract extraction      bilateral  . Dental surgery      implants  . Plantar fascitis Left   . Biopsy thyroid    . Breast surgery Left 2012     lumpectomy  . Abdominal hysterectomy  82  . Video assisted thoracoscopy (vats) w/talc pleuadesis Left 76    collapsed lung  patched  . Back surgery  2000  . Eye surgery Bilateral 07    cataracts  . Bladder tuck    . Total knee arthroplasty Left 08/04/2015    Procedure: LEFT TOTAL KNEE ARTHROPLASTY;  Surgeon: Vickey Huger, MD;  Location: [facility];  Service: Orthopedics;  Laterality: Left;     MEDICATIONS:   Prescriptions prior to admission  Medication Sig Dispense Refill Last Dose  . Ascorbic Acid (VITAMIN C) 100 MG tablet Take 100 mg by mouth daily.   Past Month at Unknown time  . Biotin 5000 MCG TABS Take 5,000 mcg by mouth daily.  Reported on 10/27/2015   Past Week at Unknown time  . Cinnamon 500 MG capsule Take 500 mg by mouth daily as needed (for supplementation).    Past Week at Unknown time  . Coconut Oil 1000 MG CAPS Take 1,000 mg by mouth daily.    Past Week at Unknown time  . Cranberry Extract 250 MG TABS Take 250 mg by mouth daily.    Past Week at Unknown time  . DHEA 25 MG CAPS Take 25 mg by mouth daily.    Past Week at Unknown time  . HYDROcodone-acetaminophen (NORCO/VICODIN) 5-325 MG tablet Take 1 tablet by mouth every 6 (six) hours as needed for moderate pain.   Past Week at Unknown time  . magnesium gluconate (MAGONATE) 500 MG tablet Take 250 mg by mouth 2 (two) times daily.   Past Week at Unknown time  . methocarbamol (ROBAXIN) 500 MG tablet Take 1-2 tablets (500-1,000 mg total) by mouth every 6 (six) hours as needed for muscle spasms. (Patient taking differently: Take 500 mg by mouth every 6 (six) hours as needed for muscle spasms. ) 60 tablet 0 Past Week at Unknown time  . Multiple Vitamins-Minerals (PRESERVISION AREDS 2 PO) Take 1  tablet by mouth 2 (two) times daily.   Past Week at Unknown time  . rizatriptan (MAXALT) 10 MG tablet Take 10 mg by mouth as needed. May repeat in 2 hours if needed   Past Month at Unknown time  . rOPINIRole (REQUIP) 3 MG tablet Take 3 mg by mouth at bedtime.    Past Week at Unknown time  . tamoxifen (NOLVADEX) 20 MG tablet Take 20 mg by mouth daily.    11/10/2015 at 0400  . vitamin B-12 (CYANOCOBALAMIN) 1000 MCG tablet Take 2,500 mcg by mouth daily.    Past Week at Unknown time  . vitamin E 400 UNIT capsule Take 400 Units by mouth daily.   Past Week at Unknown time  . zinc gluconate 50 MG tablet Take 50 mg by mouth daily.   Past Week at Unknown time  . zolpidem (AMBIEN) 5 MG tablet Take 5 mg by mouth at bedtime.   Past Month at Unknown time    ALLERGIES:   Allergies  Allergen Reactions  . Requip [Ropinirole Hcl] Other (See Comments)    Hallucinations at 4 mg strength  . Morphine  And Related Nausea And Vomiting    REVIEW OF SYSTEMS:  A comprehensive review of systems was negative except for: Musculoskeletal: positive for arthralgias, bone pain and stiff joints   FAMILY HISTORY:  History reviewed. No pertinent family history.  SOCIAL HISTORY:   Social History  Substance Use Topics  . Smoking status: Never Smoker   . Smokeless tobacco: Not on file  . Alcohol Use: No     EXAMINATION:  Vital signs in last 24 hours: Temp:  [98 F (36.7 C)] 98 F (36.7 C) (07/17 0603) Pulse Rate:  [78] 78 (07/17 0603) Resp:  [20] 20 (07/17 0603) BP: (136)/(71) 136/71 mmHg (07/17 0603) SpO2:  [100 %] 100 % (07/17 0603) Weight:  [87.091 kg (192 lb)] 87.091 kg (192 lb) (07/17 0603)  BP 136/71 mmHg  Pulse 78  Temp(Src) 98 F (36.7 C) (Oral)  Resp 20  Ht 5' 8.5" (1.74 m)  Wt 87.091 kg (192 lb)  BMI 28.77 kg/m2  SpO2 100%  General Appearance:    Alert, cooperative, no distress, appears stated age  Head:    Normocephalic, without obvious abnormality, atraumatic  Eyes:    PERRL, conjunctiva/corneas clear, EOM's intact, fundi    benign, both eyes  Ears:    Normal TM's and external ear canals, both ears  Nose:   Nares normal, septum midline, mucosa normal, no drainage    or sinus tenderness  Throat:   Lips, mucosa, and tongue normal; teeth and gums normal  Neck:   Supple, symmetrical, trachea midline, no adenopathy;    thyroid:  no enlargement/tenderness/nodules; no carotid   bruit or JVD  Back:     Symmetric, no curvature, ROM normal, no CVA tenderness  Lungs:     Clear to auscultation bilaterally, respirations unlabored  Chest Wall:    No tenderness or deformity   Heart:    Regular rate and rhythm, S1 and S2 normal, no murmur, rub   or gallop  Breast Exam:    No tenderness, masses, or nipple abnormality  Abdomen:     Soft, non-tender, bowel sounds active all four quadrants,    no masses, no organomegaly  Genitalia:    Normal female without lesion, discharge or  tenderness  Rectal:    Normal tone, no masses or tenderness;   guaiac negative stool  Extremities:   Extremities normal, atraumatic,  no cyanosis or edema  Pulses:   2+ and symmetric all extremities  Skin:   Skin color, texture, turgor normal, no rashes or lesions  Lymph nodes:   Cervical, supraclavicular, and axillary nodes normal  Neurologic:   CNII-XII intact, normal strength, sensation and reflexes    throughout    Musculoskeletal:  ROM 0-120, Ligaments intact,  Imaging Review Plain radiographs demonstrate severe degenerative joint disease of the right knee. The overall alignment is neutral. The bone quality appears to be excellent for age and reported activity level.  Assessment/Plan: Primary osteoarthritis, right knee   The patient history, physical examination and imaging studies are consistent with advanced degenerative joint disease of the right knee. The patient has failed conservative treatment.  The clearance notes were reviewed.  After discussion with the patient it was felt that Total Knee Replacement was indicated. The procedure,  risks, and benefits of total knee arthroplasty were presented and reviewed. The risks including but not limited to aseptic loosening, infection, blood clots, vascular injury, stiffness, patella tracking problems complications among others were discussed. The patient acknowledged the explanation, agreed to proceed with the plan.  Donia Ast 11/10/2015, 6:29 AM

## 2015-11-10 NOTE — Anesthesia Preprocedure Evaluation (Signed)
Anesthesia Evaluation  Patient identified by MRN, date of birth, ID band Patient awake    Reviewed: Allergy & Precautions, H&P , NPO status , Patient's Chart, lab work & pertinent test results  Airway Mallampati: II   Neck ROM: full    Dental   Pulmonary neg pulmonary ROS,    breath sounds clear to auscultation       Cardiovascular negative cardio ROS   Rhythm:regular Rate:Normal     Neuro/Psych  Headaches,    GI/Hepatic GERD  ,  Endo/Other    Renal/GU      Musculoskeletal  (+) Arthritis ,   Abdominal   Peds  Hematology   Anesthesia Other Findings   Reproductive/Obstetrics H/o breast CA                             Anesthesia Physical Anesthesia Plan  ASA: II  Anesthesia Plan: MAC and Spinal   Post-op Pain Management:    Induction: Intravenous  Airway Management Planned: Simple Face Mask  Additional Equipment:   Intra-op Plan:   Post-operative Plan:   Informed Consent: I have reviewed the patients History and Physical, chart, labs and discussed the procedure including the risks, benefits and alternatives for the proposed anesthesia with the patient or authorized representative who has indicated his/her understanding and acceptance.     Plan Discussed with: CRNA, Anesthesiologist and Surgeon  Anesthesia Plan Comments:         Anesthesia Quick Evaluation

## 2015-11-10 NOTE — Anesthesia Procedure Notes (Signed)
Spinal Patient location during procedure: OR Start time: 11/10/2015 7:01 AM End time: 11/10/2015 7:08 AM Staffing Anesthesiologist: Marcie Bal, Cristel Rail Performed by: anesthesiologist  Preanesthetic Checklist Completed: patient identified, site marked, surgical consent, pre-op evaluation, timeout performed, IV checked, risks and benefits discussed and monitors and equipment checked Spinal Block Patient position: sitting Prep: Betadine and site prepped and draped Patient monitoring: heart rate, cardiac monitor, continuous pulse ox and blood pressure Approach: midline Location: L3-4 Injection technique: single-shot Needle Needle type: Pencan  Needle gauge: 24 G Needle length: 10 cm Assessment Sensory level: T6 Additional Notes Pt tolerated the procedure well.

## 2015-11-10 NOTE — Progress Notes (Signed)
Orthopedic Tech Progress Note Patient Details:  Whitney Campbell [birth year] [id number]  CPM Right Knee CPM Right Knee: On Right Knee Flexion (Degrees): 90 Right Knee Extension (Degrees): 0 Additional Comments: trapeze bar patient helpetr Viewed order from doctor's order list  Hildred Priest 11/10/2015, 10:11 AM

## 2015-11-10 NOTE — Anesthesia Postprocedure Evaluation (Signed)
Anesthesia Post Note  Patient: Whitney Campbell  Procedure(s) Performed: Procedure(s) (LRB): RIGHT TOTAL KNEE ARTHROPLASTY (Right)  Patient location during evaluation: PACU Anesthesia Type: Spinal Level of consciousness: oriented and awake and alert Pain management: pain level controlled Vital Signs Assessment: post-procedure vital signs reviewed and stable Respiratory status: spontaneous breathing, respiratory function stable and patient connected to nasal cannula oxygen Cardiovascular status: blood pressure returned to baseline and stable Postop Assessment: no headache and no backache Anesthetic complications: no    Last Vitals:  Filed Vitals:   11/10/15 1015 11/10/15 1030  BP: 125/70 117/70  Pulse: 71 67  Temp:    Resp: 21 21    Last Pain:  Filed Vitals:   11/10/15 1032  PainSc: 0-No pain    LLE Motor Response: No movement due to regional block (11/10/15 1030)   RLE Motor Response: No movement due to regional block (11/10/15 1030)   L Sensory Level: L4-Anterior knee, lower leg (11/10/15 1030) R Sensory Level: L2-Upper inner thigh, upper buttock (11/10/15 1030)  [name] S

## 2015-11-10 NOTE — Evaluation (Signed)
Physical Therapy Evaluation Patient Details Name: Whitney Campbell MRN: [id number] DOB: Aug 26, 1935 Today's Date: 11/10/2015   History of Present Illness  80 y.o. female s/p R TKA. PMH: L TKA 4/17, DDD, back surgery, breast cancer.   Clinical Impression  Pt is s/p TKA resulting in the deficits listed below (see PT Problem List). Pt ambulated to and from bathroom with min-guard A and RW without dizziness or nausea.  Pt will benefit from skilled PT to increase their independence and safety with mobility to allow discharge to the venue listed below.     Follow Up Recommendations Home health PT;Supervision - Intermittent    Equipment Recommendations  None recommended by PT    Recommendations for Other Services       Precautions / Restrictions Precautions Precautions: Knee Precaution Booklet Issued: No Precaution Comments: reviewed proper positioning and use of zero knee foam and CPM Restrictions Weight Bearing Restrictions: Yes RLE Weight Bearing: Weight bearing as tolerated      Mobility  Bed Mobility               General bed mobility comments: pt sitting EOB eating lunch upon PT arrival  Transfers Overall transfer level: Needs assistance Equipment used: Rolling walker (2 wheeled) Transfers: Sit to/from Stand Sit to Stand: Min guard         General transfer comment: min-guard for safety and vc's for backing fully to surface and not letting go of RW too son  Ambulation/Gait Ambulation/Gait assistance: Min guard Ambulation Distance (Feet): 20 Feet Assistive device: Rolling walker (2 wheeled) Gait Pattern/deviations: Step-to pattern;Decreased weight shift to right;Decreased step length - right Gait velocity: decreased Gait velocity interpretation: Below normal speed for age/gender General Gait Details: vc's to lift RLE instead of sliding, pt began to have pain RLE when ambulating  Stairs            Wheelchair Mobility    Modified Rankin (Stroke  Patients Only)       Balance Overall balance assessment: Needs assistance Sitting-balance support: No upper extremity supported Sitting balance-Leahy Scale: Normal     Standing balance support: Single extremity supported Standing balance-Leahy Scale: Fair Standing balance comment: pt able to stand to clean self on toilet but needed grab bar one side                             Pertinent Vitals/Pain Pain Assessment: 0-10 Pain Score: 6  Pain Location: right knee Pain Descriptors / Indicators: Aching Pain Intervention(s): Limited activity within patient's tolerance;Patient requesting pain meds-RN notified;RN gave pain meds during session;Monitored during session    Cole expects to be discharged to:: Private residence Living Arrangements: Alone Available Help at Discharge: Family;Available 24 hours/day Type of Home: House Home Access: Stairs to enter   [employer] of Steps: 1 Home Layout: One level Home Equipment: Walker - 2 wheels;Cane - single point;Bedside commode;Shower seat;Hand held shower head      Prior Function Level of Independence: Independent         Comments: has done very well since first TKA in April     Hand Dominance   Dominant Hand: Right    Extremity/Trunk Assessment   Upper Extremity Assessment: Overall WFL for tasks assessed           Lower Extremity Assessment: RLE deficits/detail RLE Deficits / Details: hip and ankle WFL, knee ext 2+/5    Cervical / Trunk Assessment: Normal  Communication  Communication: No difficulties  Cognition Arousal/Alertness: Awake/alert Behavior During Therapy: WFL for tasks assessed/performed Overall Cognitive Status: Within Functional Limits for tasks assessed                      General Comments General comments (skin integrity, edema, etc.): pt unable to tolerate zero knee foam in chair after session, encouraged to get in it after she received her  pain medicine    Exercises Total Joint Exercises Ankle Circles/Pumps: AROM;Both;20 reps;Seated Quad Sets: AROM;Both;20 reps;Seated      Assessment/Plan    PT Assessment Patient needs continued PT services  PT Diagnosis Difficulty walking;Abnormality of gait;Acute pain   PT Problem List Decreased strength;Decreased range of motion;Decreased activity tolerance;Decreased balance;Decreased mobility;Decreased knowledge of precautions;Pain  PT Treatment Interventions DME instruction;Gait training;Stair training;Functional mobility training;Therapeutic activities;Therapeutic exercise;Balance training;Patient/family education   PT Goals (Current goals can be found in the Care Plan section) Acute Rehab PT Goals Patient Stated Goal: return home PT Goal Formulation: With patient Time For Goal Achievement: 11/17/15 Potential to Achieve Goals: Good    Frequency 7X/week   Barriers to discharge        Co-evaluation               End of Session Equipment Utilized During Treatment: Gait belt Activity Tolerance: Patient tolerated treatment well Patient left: in chair;with call bell/phone within reach;with family/visitor present Nurse Communication: Mobility status         Time: 1341-1404 PT Time Calculation (min) (ACUTE ONLY): 23 min   Charges:   PT Evaluation $PT Eval Low Complexity: 1 Procedure PT Treatments $Gait Training: 8-22 mins   PT G Codes:      Leighton Roach, PT  Acute Rehab Services  [place], [place] 11/10/2015, 2:24 PM

## 2015-11-10 NOTE — Transfer of Care (Signed)
Immediate Anesthesia Transfer of Care Note  Patient: Whitney Campbell  Procedure(s) Performed: Procedure(s): RIGHT TOTAL KNEE ARTHROPLASTY (Right)  Patient Location: PACU  Anesthesia Type:MAC and Spinal  Level of Consciousness: awake, alert  and oriented  Airway & Oxygen Therapy: Patient Spontanous Breathing and Patient connected to face mask oxygen  Post-op Assessment: Report given to RN and Post -op Vital signs reviewed and stable  Post vital signs: Reviewed and stable  Last Vitals:  Filed Vitals:   11/10/15 0603  BP: 136/71  Pulse: 78  Temp: 36.7 C  Resp: 20    Last Pain:  Filed Vitals:   11/10/15 0627  PainSc: 10-Worst pain ever         Complications: No apparent anesthesia complications

## 2015-11-11 ENCOUNTER — Encounter (HOSPITAL_COMMUNITY): Payer: Self-pay | Admitting: Orthopedic Surgery

## 2015-11-11 LAB — BASIC METABOLIC PANEL
ANION GAP: 4 — AB (ref 5–15)
BUN: 17 mg/dL (ref 6–20)
CALCIUM: 9.6 mg/dL (ref 8.9–10.3)
CO2: 26 mmol/L (ref 22–32)
Chloride: 108 mmol/L (ref 101–111)
Creatinine, Ser: 1.08 mg/dL — ABNORMAL HIGH (ref 0.44–1.00)
GFR calc Af Amer: 56 mL/min — ABNORMAL LOW (ref >60)
GFR calc non Af Amer: 48 mL/min — ABNORMAL LOW (ref >60)
GLUCOSE: 103 mg/dL — AB (ref 65–99)
POTASSIUM: 4.5 mmol/L (ref 3.5–5.1)
Sodium: 138 mmol/L (ref 135–145)

## 2015-11-11 LAB — CBC
HCT: 32.1 % — ABNORMAL LOW (ref 36.0–46.0)
Hemoglobin: 10.4 g/dL — ABNORMAL LOW (ref 12.0–15.0)
MCH: 30.3 pg (ref 26.0–34.0)
MCHC: 32.4 g/dL (ref 30.0–36.0)
MCV: 93.6 fL (ref 78.0–100.0)
Platelets: 95 10*3/uL — ABNORMAL LOW (ref 150–400)
RBC: 3.43 MIL/uL — ABNORMAL LOW (ref 3.87–5.11)
RDW: 14.8 % (ref 11.5–15.5)
WBC: 6.2 10*3/uL (ref 4.0–10.5)

## 2015-11-11 NOTE — Progress Notes (Signed)
Physical Therapy Treatment Patient Details Name: Whitney Campbell MRN: [id number] DOB: 1935/10/19 Today's Date: 11/11/2015    History of Present Illness 80 y.o. female s/p R TKA. PMH: L TKA 4/17, DDD, back surgery, breast cancer.     PT Comments    Pt performed increased mobility.  Required training for curb negotiation but able to teach back method and perform during session.  HEP issued and content and frequency reviewed for accuracy.  Pt is ready to d/c at this time. Informed nursing that patient is ready.    Follow Up Recommendations  Home health PT;Supervision - Intermittent     Equipment Recommendations  None recommended by PT    Recommendations for Other Services       Precautions / Restrictions Precautions Precautions: Knee Precaution Booklet Issued: No Precaution Comments: reviewed proper positioning and use of zero knee foam and CPM Restrictions Weight Bearing Restrictions: Yes RLE Weight Bearing: Weight bearing as tolerated    Mobility  Bed Mobility               General bed mobility comments: Pt sitting in recliner chair on arrival.    Transfers Overall transfer level: Needs assistance Equipment used: Rolling walker (2 wheeled) Transfers: Sit to/from Stand Sit to Stand: Supervision         General transfer comment: Cues for hand placement and forward advancement of RLE to decrease pain during transition.    Ambulation/Gait Ambulation/Gait assistance: Min guard;Supervision Ambulation Distance (Feet): 150 Feet Assistive device: Rolling walker (2 wheeled) Gait Pattern/deviations: Step-through pattern;Decreased weight shift to right;Antalgic;Trunk flexed Gait velocity: decreased Gait velocity interpretation: Below normal speed for age/gender General Gait Details: VCs for upper trunk control and L foot clearance to improve ease and decrease abormality in gait pattern.     Stairs Stairs: Yes Stairs assistance: Min guard Stair Management: No  rails;With walker;Step to pattern;Forwards Number of Stairs: 2 General stair comments: Pt performed curb training x2 trials and required cues for sequencing, RW placement and body position.  Pt performed repeated rep to teach back technique to therapist.  Wheelchair Mobility    Modified Rankin (Stroke Patients Only)       Balance Overall balance assessment: Needs assistance Sitting-balance support: No upper extremity supported Sitting balance-Leahy Scale: Normal       Standing balance-Leahy Scale: Fair                      Cognition Arousal/Alertness: Awake/alert Behavior During Therapy: WFL for tasks assessed/performed Overall Cognitive Status: Within Functional Limits for tasks assessed                      Exercises Total Joint Exercises Ankle Circles/Pumps: AROM;Both;10 reps;Supine Quad Sets: AROM;Right;10 reps;Supine Towel Squeeze: AROM;Both;10 reps;Supine Short Arc Quad: AROM;Right;10 reps;Supine Heel Slides: AROM;Right;10 reps;Supine Hip ABduction/ADduction: AROM;Right;10 reps;Supine Straight Leg Raises: AAROM;Right;10 reps;Supine Long Arc Quad: AAROM;Right;10 reps;Supine Knee Flexion: AROM;AAROM;Right;10 reps;Seated Goniometric ROM: 2-86 degrees AROM to R knee.      General Comments        Pertinent Vitals/Pain Pain Assessment: 0-10 Pain Score: 5  Pain Location: R knee Pain Descriptors / Indicators: Aching Pain Intervention(s): Limited activity within patient's tolerance;Premedicated before session    Home Living                      Prior Function            PT Goals (current goals can now be found in  the care plan section) Acute Rehab PT Goals Patient Stated Goal: return home Potential to Achieve Goals: Good Progress towards PT goals: Progressing toward goals    Frequency  7X/week    PT Plan Current plan remains appropriate    Co-evaluation             End of Session Equipment Utilized During Treatment:  Gait belt Activity Tolerance: Patient tolerated treatment well Patient left: in chair;with call bell/phone within reach;with family/visitor present     Time: [id number] PT Time Calculation (min) (ACUTE ONLY): 40 min  Charges:  $Gait Training: 8-22 mins $Therapeutic Exercise: 8-22 mins $Therapeutic Activity: 8-22 mins                    G Codes:      Whitney Campbell 2015/12/06, 12:51 PM Whitney Campbell, PTA pager [phone]

## 2015-11-11 NOTE — Progress Notes (Signed)
SPORTS MEDICINE AND JOINT REPLACEMENT  Lara Mulch, MD    Carlyon Shadow, PA-C [place], [place], [place]  [id number]                             [phone]   PROGRESS NOTE  Subjective:  negative for Chest Pain  negative for Shortness of Breath  negative for Nausea/Vomiting   negative for Calf Pain  negative for Bowel Movement   Tolerating Diet: yes         Patient reports pain as 5 on 0-10 scale.    Objective: Vital signs in last 24 hours:   Patient Vitals for the past 24 hrs:  BP Temp Temp src Pulse Resp SpO2  11/11/15 0457 (!) 135/58 mmHg 98.5 F (36.9 C) Oral 87 16 98 %  11/10/15 2353 (!) 118/50 mmHg - - 84 16 99 %  11/10/15 1753 (!) 115/56 mmHg 97.8 F (36.6 C) Oral 70 16 99 %  11/10/15 1255 109/63 mmHg - - 79 16 100 %  11/10/15 1215 (!) 111/95 mmHg 97.8 F (36.6 C) - 67 16 100 %  11/10/15 1200 112/77 mmHg - - 71 15 100 %  11/10/15 1145 118/83 mmHg - - 69 16 100 %  11/10/15 1130 113/85 mmHg - - (!) 59 14 100 %  11/10/15 1115 125/64 mmHg - - 71 15 100 %  11/10/15 1100 (!) 112/52 mmHg - - 61 14 100 %  11/10/15 1045 123/64 mmHg - - 64 12 100 %  11/10/15 1030 117/70 mmHg - - 67 (!) 21 100 %  11/10/15 1015 125/70 mmHg - - 71 (!) 21 98 %  11/10/15 1000 110/74 mmHg - - 69 12 99 %  11/10/15 0945 105/61 mmHg - - 69 12 100 %  11/10/15 0927 106/61 mmHg (!) 96.9 F (36.1 C) - 74 16 100 %    @flow {1959:LAST@   Intake/Output from previous day:   07/17 0701 - 07/18 0700 In: 1940 [P.O.:240; I.V.:1600] Out: 25    Intake/Output this shift:       Intake/Output      07/17 0701 - 07/18 0700 07/18 0701 - 07/19 0700   P.O. 240    I.V. (mL/kg) 1600 (18.4)    IV Piggyback 100    Total Intake(mL/kg) 1940 (22.3)    Urine (mL/kg/hr) 0 (0)    Blood 25 (0)    Total Output 25     Net +1915          Urine Occurrence 3 x       LABORATORY DATA:  Recent Labs  11/11/15 0428  WBC 6.2  HGB 10.4*  HCT 32.1*  PLT 95*    Recent Labs  11/11/15 0428  NA 138   K 4.5  CL 108  CO2 26  BUN 17  CREATININE 1.08*  GLUCOSE 103*  CALCIUM 9.6   Lab Results  Component Value Date   INR 1.13 10/29/2015   INR 1.08 07/30/2015    Examination:  General appearance: alert, cooperative and no distress Extremities: extremities normal, atraumatic, no cyanosis or edema  Wound Exam: clean, dry, intact   Drainage:  Scant/small amount Serous exudate  Motor Exam: Quadriceps and Hamstrings Intact  Sensory Exam: Superficial Peroneal, Deep Peroneal and Tibial normal   Assessment:    1 Day Post-Op  Procedure(s) (LRB): RIGHT TOTAL KNEE ARTHROPLASTY (Right)  ADDITIONAL DIAGNOSIS:  Active Problems:   S/P total knee replacement  Acute Blood Loss Anemia   Plan: Physical Therapy as ordered Weight Bearing as Tolerated (WBAT)  DVT Prophylaxis:  Aspirin  DISCHARGE PLAN: Home  DISCHARGE NEEDS: HHPT   Patient is doing excellent. Will D/C today         Donia Ast 11/11/2015, 7:06 AM

## 2015-11-11 NOTE — Care Management Note (Signed)
Case Management Note  Patient Details  Name: Whitney Campbell MRN: [id number] Date of Birth: [birth year]  Subjective/Objective:   80 yr old female s/p right total knee arthroplasty.               Action/Plan: Patient was preoperatively setup with [facility], no changes. Patient has rolling walker and 3in1 from previous surgery. Patient states that [employer] will deliver CPM to her home on 11/12/15 AM.  She will have family support at discharge.   Expected Discharge Date:     11/11/15             Expected Discharge Plan:  [place]  In-House Referral:     Discharge planning Services  CM Consult  Post Acute Care Choice:  Home Health Choice offered to:  Patient  DME Arranged:  CPM DME Agency:  [facility]  HH Arranged:  PT [place] Agency:  [facility] (now [facility])  Status of Service:  Completed, signed off  If discussed at [place] of Stay Meetings, dates discussed:    Additional Comments:  Ninfa Meeker, RN 11/11/2015, 11:10 AM

## 2015-11-11 NOTE — Discharge Summary (Signed)
SPORTS MEDICINE & JOINT REPLACEMENT   Lara Mulch, MD   Carlyon Shadow, PA-C [place], [place], [place]  [address]                             [phone]  PATIENT ID: [id number] Campbell        Whitney Campbell          DOB/AGE: July 14, 1935 / 80 y.o.    DISCHARGE SUMMARY  Whitney DATE:    11/10/2015 DISCHARGE DATE:   11/11/2015   Whitney DIAGNOSIS: Primary Osteoarthritis Right Knee    DISCHARGE DIAGNOSIS:  Primary Osteoarthritis Right Knee    ADDITIONAL DIAGNOSIS: Active Problems:   S/P total knee replacement  Past Medical History  Diagnosis Date  . Elevated cholesterol   . Pneumothorax     congential  defect x 3 surgery corrected in 1976  . Migraine   . Breast cancer ([place])   . DDD (degenerative disc disease), lumbar   . Sciatic pain   . Headache     occ migraines  . Arthritis   . Cancer [facility]) 2012    breast left  . GERD (gastroesophageal reflux disease)     hx in past  . Stress bladder incontinence, female     PROCEDURE: Procedure(s): RIGHT TOTAL KNEE ARTHROPLASTY on 11/10/2015  CONSULTS:     HISTORY:  See H&P in chart  HOSPITAL COURSE:  MARDEE MUNNERLYN is a 80 y.o. admitted on 11/10/2015 and found to have a diagnosis of Primary Osteoarthritis Right Knee.  After appropriate laboratory studies were obtained  they were taken to the operating room on 11/10/2015 and underwent Procedure(s): RIGHT TOTAL KNEE ARTHROPLASTY.   They were given perioperative antibiotics:  Anti-infectives    Start     Dose/Rate Route Frequency Ordered Stop   11/10/15 1300  ceFAZolin (ANCEF) IVPB 1 g/50 mL premix     1 g 100 mL/hr over 30 Minutes Intravenous Every 6 hours 11/10/15 1252 11/10/15 1928   11/10/15 0700  ceFAZolin (ANCEF) IVPB 2g/100 mL premix     2 g 200 mL/hr over 30 Minutes Intravenous To [facility] 11/07/15 1428 11/10/15 0742    .  Patient given tranexamic acid IV or topical and exparel intra-operatively.  Tolerated the procedure well.    POD#  1: Vital signs were stable.  Patient denied Chest pain, shortness of breath, or calf pain.  Patient was started on Lovenox 30 mg subcutaneously twice daily at 8am.  Consults to PT, OT, and care management were made.  The patient was weight bearing as tolerated.  CPM was placed on the operative leg 0-90 degrees for 6-8 hours a day. When out of the CPM, patient was placed in the foam block to achieve full extension. Incentive spirometry was taught.  Dressing was changed.       POD #2, Continued  PT for ambulation and exercise program.  IV saline locked.  O2 discontinued.    The remainder of the hospital course was dedicated to ambulation and strengthening.   The patient was discharged on 1 Day Post-Op in  Good condition.  Blood products given:none  DIAGNOSTIC STUDIES: Recent vital signs: Patient Vitals for the past 24 hrs:  BP Temp Temp src Pulse Resp SpO2  11/11/15 0457 (!) 135/58 mmHg 98.5 F (36.9 C) Oral 87 16 98 %  11/10/15 2353 (!) 118/50 mmHg - - 84 16 99 %  11/10/15 1753 (!) 115/56  mmHg 97.8 F (36.6 C) Oral 70 16 99 %  11/10/15 1255 109/63 mmHg - - 79 16 100 %  11/10/15 1215 (!) 111/95 mmHg 97.8 F (36.6 C) - 67 16 100 %  11/10/15 1200 112/77 mmHg - - 71 15 100 %  11/10/15 1145 118/83 mmHg - - 69 16 100 %  11/10/15 1130 113/85 mmHg - - (!) 59 14 100 %  11/10/15 1115 125/64 mmHg - - 71 15 100 %  11/10/15 1100 (!) 112/52 mmHg - - 61 14 100 %  11/10/15 1045 123/64 mmHg - - 64 12 100 %  11/10/15 1030 117/70 mmHg - - 67 (!) 21 100 %  11/10/15 1015 125/70 mmHg - - 71 (!) 21 98 %  11/10/15 1000 110/74 mmHg - - 69 12 99 %  11/10/15 0945 105/61 mmHg - - 69 12 100 %  11/10/15 0927 106/61 mmHg (!) 96.9 F (36.1 C) - 74 16 100 %       Recent laboratory studies:  Recent Labs  11/11/15 0428  WBC 6.2  HGB 10.4*  HCT 32.1*  PLT 95*    Recent Labs  11/11/15 0428  NA 138  K 4.5  CL 108  CO2 26  BUN 17  CREATININE 1.08*  GLUCOSE 103*  CALCIUM 9.6   Lab Results   Component Value Date   INR 1.13 10/29/2015   INR 1.08 07/30/2015     Recent Radiographic Studies :  No results found.  DISCHARGE INSTRUCTIONS:   DISCHARGE MEDICATIONS:     Medication List    ASK your doctor about these medications        Biotin 5000 MCG Tabs  Take 5,000 mcg by mouth daily. Reported on 10/27/2015     Cinnamon 500 MG capsule  Take 500 mg by mouth daily as needed (for supplementation).     Coconut Oil 1000 MG Caps  Take 1,000 mg by mouth daily.     Cranberry Extract 250 MG Tabs  Take 250 mg by mouth daily.     DHEA 25 MG Caps  Take 25 mg by mouth daily.     HYDROcodone-acetaminophen 5-325 MG tablet  Commonly known as:  NORCO/VICODIN  Take 1 tablet by mouth every 6 (six) hours as needed for moderate pain.     magnesium gluconate 500 MG tablet  Commonly known as:  MAGONATE  Take 250 mg by mouth 2 (two) times daily.     methocarbamol 500 MG tablet  Commonly known as:  ROBAXIN  Take 1-2 tablets (500-1,000 mg total) by mouth every 6 (six) hours as needed for muscle spasms.     PRESERVISION AREDS 2 PO  Take 1 tablet by mouth 2 (two) times daily.     rizatriptan 10 MG tablet  Commonly known as:  MAXALT  Take 10 mg by mouth as needed. May repeat in 2 hours if needed     rOPINIRole 3 MG tablet  Commonly known as:  REQUIP  Take 3 mg by mouth at bedtime.     tamoxifen 20 MG tablet  Commonly known as:  NOLVADEX  Take 20 mg by mouth daily.     vitamin B-12 1000 MCG tablet  Commonly known as:  CYANOCOBALAMIN  Take 2,500 mcg by mouth daily.     vitamin C 100 MG tablet  Take 100 mg by mouth daily.     vitamin E 400 UNIT capsule  Take 400 Units by mouth daily.     zinc gluconate  50 MG tablet  Take 50 mg by mouth daily.     zolpidem 5 MG tablet  Commonly known as:  AMBIEN  Take 5 mg by mouth at bedtime.        FOLLOW UP VISIT:    DISPOSITION: HOME VS. SNF  CONDITION:  Good   Donia Ast 11/11/2015, 7:07 AM

## 2015-11-11 NOTE — Evaluation (Signed)
Occupational Therapy Evaluation Patient Details Name: Whitney Campbell MRN: [id number] DOB: 04/20/1936 Today's Date: 11/11/2015    History of Present Illness 80 y.o. female s/p R TKA. PMH: L TKA 4/17, DDD, back surgery, breast cancer.    Clinical Impression   Pt is at set up - min A level with ADLs and sup with ADL mobility and transfers. Pt will have 24/7 assist from her daughter at home prn. Pt has necessary DME and A/E at [facility] from previous ortho surgery. All education completed and no further acute OT indicated    Follow Up Recommendations  No OT follow up;Supervision - Intermittent    Equipment Recommendations  None recommended by OT    Recommendations for Other Services       Precautions / Restrictions Precautions Precautions: Knee Precaution Booklet Issued: No Precaution Comments: reviewed proper positioning and use of zero knee foam and CPM Restrictions Weight Bearing Restrictions: Yes RLE Weight Bearing: Weight bearing as tolerated      Mobility Bed Mobility               General bed mobility comments: Pt sitting in recliner chair on arrival.    Transfers Overall transfer level: Needs assistance Equipment used: Rolling walker (2 wheeled) Transfers: Sit to/from Stand Sit to Stand: Supervision         General transfer comment: Cues for hand placement and forward advancement of RLE to decrease pain during transition.      Balance Overall balance assessment: Needs assistance Sitting-balance support: No upper extremity supported;Feet supported Sitting balance-Leahy Scale: Good     Standing balance support: During functional activity Standing balance-Leahy Scale: Fair                              ADL Overall ADL's : Needs assistance/impaired     Grooming: Wash/dry hands;Wash/dry face;Supervision/safety;Set up;Standing;Brushing hair   Upper Body Bathing: Set up;Sitting   Lower Body Bathing: Minimal assistance   Upper Body  Dressing : Set up;Sitting   Lower Body Dressing: Minimal assistance   Toilet Transfer: Comfort height toilet;RW;Ambulation;Supervision/safety   Toileting- Clothing Manipulation and Hygiene: Min guard;Sit to/from stand   Tub/ Banker: Supervision/safety;3 in 1   Functional mobility during ADLs: Rolling walker;Supervision/safety       Vision  wears reading glasses, no change from baseline              Pertinent Vitals/Pain Pain Assessment: 0-10 Pain Score: 3  Pain Location: R knee Pain Descriptors / Indicators: Aching;Sore Pain Intervention(s): Monitored during session;Premedicated before session     Hand Dominance Right   Extremity/Trunk Assessment Upper Extremity Assessment Upper Extremity Assessment: Overall WFL for tasks assessed   Lower Extremity Assessment Lower Extremity Assessment: Defer to PT evaluation   Cervical / Trunk Assessment Cervical / Trunk Assessment: Normal   Communication Communication Communication: No difficulties   Cognition Arousal/Alertness: Awake/alert Behavior During Therapy: WFL for tasks assessed/performed Overall Cognitive Status: Within Functional Limits for tasks assessed                     General Comments   pt very pleasant and cooperative, daughter very supportive                 Home Living Family/patient expects to be discharged to:: Private residence Living Arrangements: Alone Available Help at Discharge: Family;Available 24 hours/day Type of Home: House Home Access: Stairs to enter [employer] of Steps: 1  Home Layout: One level     Bathroom Shower/Tub: Walk-in shower;Door   [employer] Toilet: Standard     Home Equipment: Environmental consultant - 2 wheels;Cane - single point;Bedside commode;Shower seat;Hand held shower head          Prior Functioning/Environment Level of Independence: Independent        Comments: has done very well since first TKA in April    OT Diagnosis: Acute  pain   OT Problem List: Impaired balance (sitting and/or standing);Pain;Decreased activity tolerance   OT Treatment/Interventions:      OT Goals(Current goals can be found in the care plan section) Acute Rehab OT Goals Patient Stated Goal: return home OT Goal Formulation: With patient/family Time For Goal Achievement: 11/18/15 Potential to Achieve Goals: Good  OT Frequency:     Barriers to D/C:    none                     End of Session Equipment Utilized During Treatment: Rolling walker;Gait belt;Other (comment) (3 in 1) CPM Right Knee CPM Right Knee: Off  Activity Tolerance: Patient tolerated treatment well Patient left: in bed;with call bell/phone within reach;with family/visitor present   Time: [id number] OT Time Calculation (min): 27 min Charges:  OT General Charges $OT Visit: 1 Procedure OT Evaluation $OT Eval Moderate Complexity: 1 Procedure OT Treatments $Therapeutic Activity: 8-22 mins G-Codes:    Britt Bottom 11/11/2015, 1:01 PM

## 2015-11-11 NOTE — Care Management Important Message (Signed)
Important Message  Patient Details  Name: Whitney Campbell MRN: [id number] Date of Birth: 1935/07/01   Medicare Important Message Given:  Yes    Loann Quill 11/11/2015, 8:04 AM

## 2015-11-11 NOTE — Op Note (Addendum)
TOTAL KNEE REPLACEMENT OPERATIVE NOTE:  11/10/2015  8:04 AM  PATIENT:  Whitney Campbell  80 y.o. female  PRE-OPERATIVE DIAGNOSIS:  Primary Osteoarthritis Right Knee  POST-OPERATIVE DIAGNOSIS:  Primary Osteoarthritis Right Knee  PROCEDURE:  Procedure(s): RIGHT TOTAL KNEE ARTHROPLASTY  SURGEON:  Surgeon(s): Vickey Huger, MD  PHYSICIAN ASSISTANT: Carlyon Shadow, PA-C  ANESTHESIA:   spinal  DRAINS: Hemovac  SPECIMEN: None  COUNTS:  Correct  TOURNIQUET:   Total Tourniquet Time Documented: Thigh (Right) - 43 minutes Total: Thigh (Right) - 43 minutes   DICTATION:  Indication for procedure:    The patient is a 80 y.o. female who has failed conservative treatment for Primary Osteoarthritis Right Knee.  Informed consent was obtained prior to anesthesia. The risks versus benefits of the operation were explain and in a way the patient can, and did, understand.   On the implant demand matching protocol, this patient scored 10.  Therefore, this patient did" "did not receive a polyethylene insert with vitamin E which is a high demand implant.  Description of procedure:     The patient was taken to the operating room and placed under anesthesia.  The patient was positioned in the usual fashion taking care that all body parts were adequately padded and/or protected.  I foley catheter was not placed.  A tourniquet was applied and the leg prepped and draped in the usual sterile fashion.  The extremity was exsanguinated with the esmarch and tourniquet inflated to 350 mmHg.  Pre-operative range of motion was normal.  The knee was in 5 degree of mild valgus.  A midline incision approximately 6-7 inches long was made with a #10 blade.  A new blade was used to make a parapatellar arthrotomy going 2-3 cm into the quadriceps tendon, over the patella, and alongside the medial aspect of the patellar tendon.  A synovectomy was then performed with the #10 blade and forceps. I then elevated the deep MCL  off the medial tibial metaphysis subperiosteally around to the semimembranosus attachment.    I everted the patella and used calipers to measure patellar thickness.  I used the reamer to ream down to appropriate thickness to recreate the native thickness.  I then removed excess bone with the rongeur and sagittal saw.  I used the appropriately sized template and drilled the three lug holes.  I then put the trial in place and measured the thickness with the calipers to ensure recreation of the native thickness.  The trial was then removed and the patella subluxed and the knee brought into flexion.  A homan retractor was place to retract and protect the patella and lateral structures.  A Z-retractor was place medially to protect the medial structures.  The extra-medullary alignment system was used to make cut the tibial articular surface perpendicular to the anamotic axis of the tibia and in 3 degrees of posterior slope.  The cut surface and alignment jig was removed.  I then used the intramedullary alignment guide to make a 4 valgus cut on the distal femur.  I then marked out the epicondylar axis on the distal femur.  The posterior condylar axis measured 5 degrees.  I then used the anterior referencing sizer and measured the femur to be a size 8.  The 4-In-1 cutting block was screwed into place in external rotation matching the posterior condylar angle, making our cuts perpendicular to the epicondylar axis.  Anterior, posterior and chamfer cuts were made with the sagittal saw.  The cutting block and cut  pieces were removed.  A lamina spreader was placed in 90 degrees of flexion.  The ACL, PCL, menisci, and posterior condylar osteophytes were removed.  A 12 mm spacer blocked was found to offer good flexion and extension gap balance after moderate in degree releasing.   The scoop retractor was then placed and the femoral finishing block was pinned in place.  The small sagittal saw was used as well as the lug  drill to finish the femur.  The block and cut surfaces were removed and the medullary canal hole filled with autograft bone from the cut pieces.  The tibia was delivered forward in deep flexion and external rotation.  A size E tray was selected and pinned into place centered on the medial 1/3 of the tibial tubercle.  The reamer and keel was used to prepare the tibia through the tray.    I then trialed with the size 8 femur, size E tibia, a 12 mm insert and the 35 patella.  I had excellent flexion/extension gap balance, excellent patella tracking.  Flexion was full and beyond 120 degrees; extension was zero.  These components were chosen and the staff opened them to me on the back table while the knee was lavaged copiously and the cement mixed.  The soft tissue was infiltrated with 60cc of exparel 1.3% through a 21 gauge needle.  I cemented in the components and removed all excess cement.  The polyethylene tibial component was snapped into place and the knee placed in extension while cement was hardening.  The capsule was infilltrated with 30cc of .25% Marcaine with epinephrine.  A hemovac was place in the joint exiting superolaterally.  A pain pump was place superomedially superficial to the arthrotomy.  Once the cement was hard, the tourniquet was let down.  Hemostasis was obtained.  The arthrotomy was closed with figure-8 #1 vicryl sutures.  The deep soft tissues were closed with #0 vicryls and the subcuticular layer closed with a running #2-0 vicryl.  The skin was reapproximated and closed with skin staples.  The wound was dressed with xeroform, 4 x4's, 2 ABD sponges, a single layer of webril and a TED stocking.   The patient was then awakened, extubated, and taken to the recovery room in stable condition.  BLOOD LOSS:  300cc DRAINS: 1 hemovac, 1 pain catheter COMPLICATIONS:  None.  PLAN OF CARE: Admit to inpatient   PATIENT DISPOSITION:  PACU - hemodynamically stable.   Delay start of  Pharmacological VTE agent (>24hrs) due to surgical blood loss or risk of bleeding:  not applicable  Please fax a copy of this op note to my office at [phone] (please only include page 1 and 2 of the Case Information op note)

## 2016-02-19 DIAGNOSIS — Z79810 Long term (current) use of selective estrogen receptor modulators (SERMs): Secondary | ICD-10-CM

## 2016-02-19 HISTORY — DX: Long term (current) use of selective estrogen receptor modulators (serms): Z79.810

## 2016-03-04 DIAGNOSIS — G8929 Other chronic pain: Secondary | ICD-10-CM

## 2016-03-04 HISTORY — DX: Other chronic pain: G89.29

## 2016-05-25 DIAGNOSIS — Z9889 Other specified postprocedural states: Secondary | ICD-10-CM

## 2016-05-25 HISTORY — DX: Other specified postprocedural states: Z98.890

## 2017-02-07 IMAGING — CR DG CHEST 2V
2 series · 2 of 2 positions shown · non-contrast
Comparison: September 20, 2009

CLINICAL DATA: Preoperative evaluation for knee replacement.
History of breast carcinoma

EXAM:
CHEST  2 VIEW

[w chest pa]
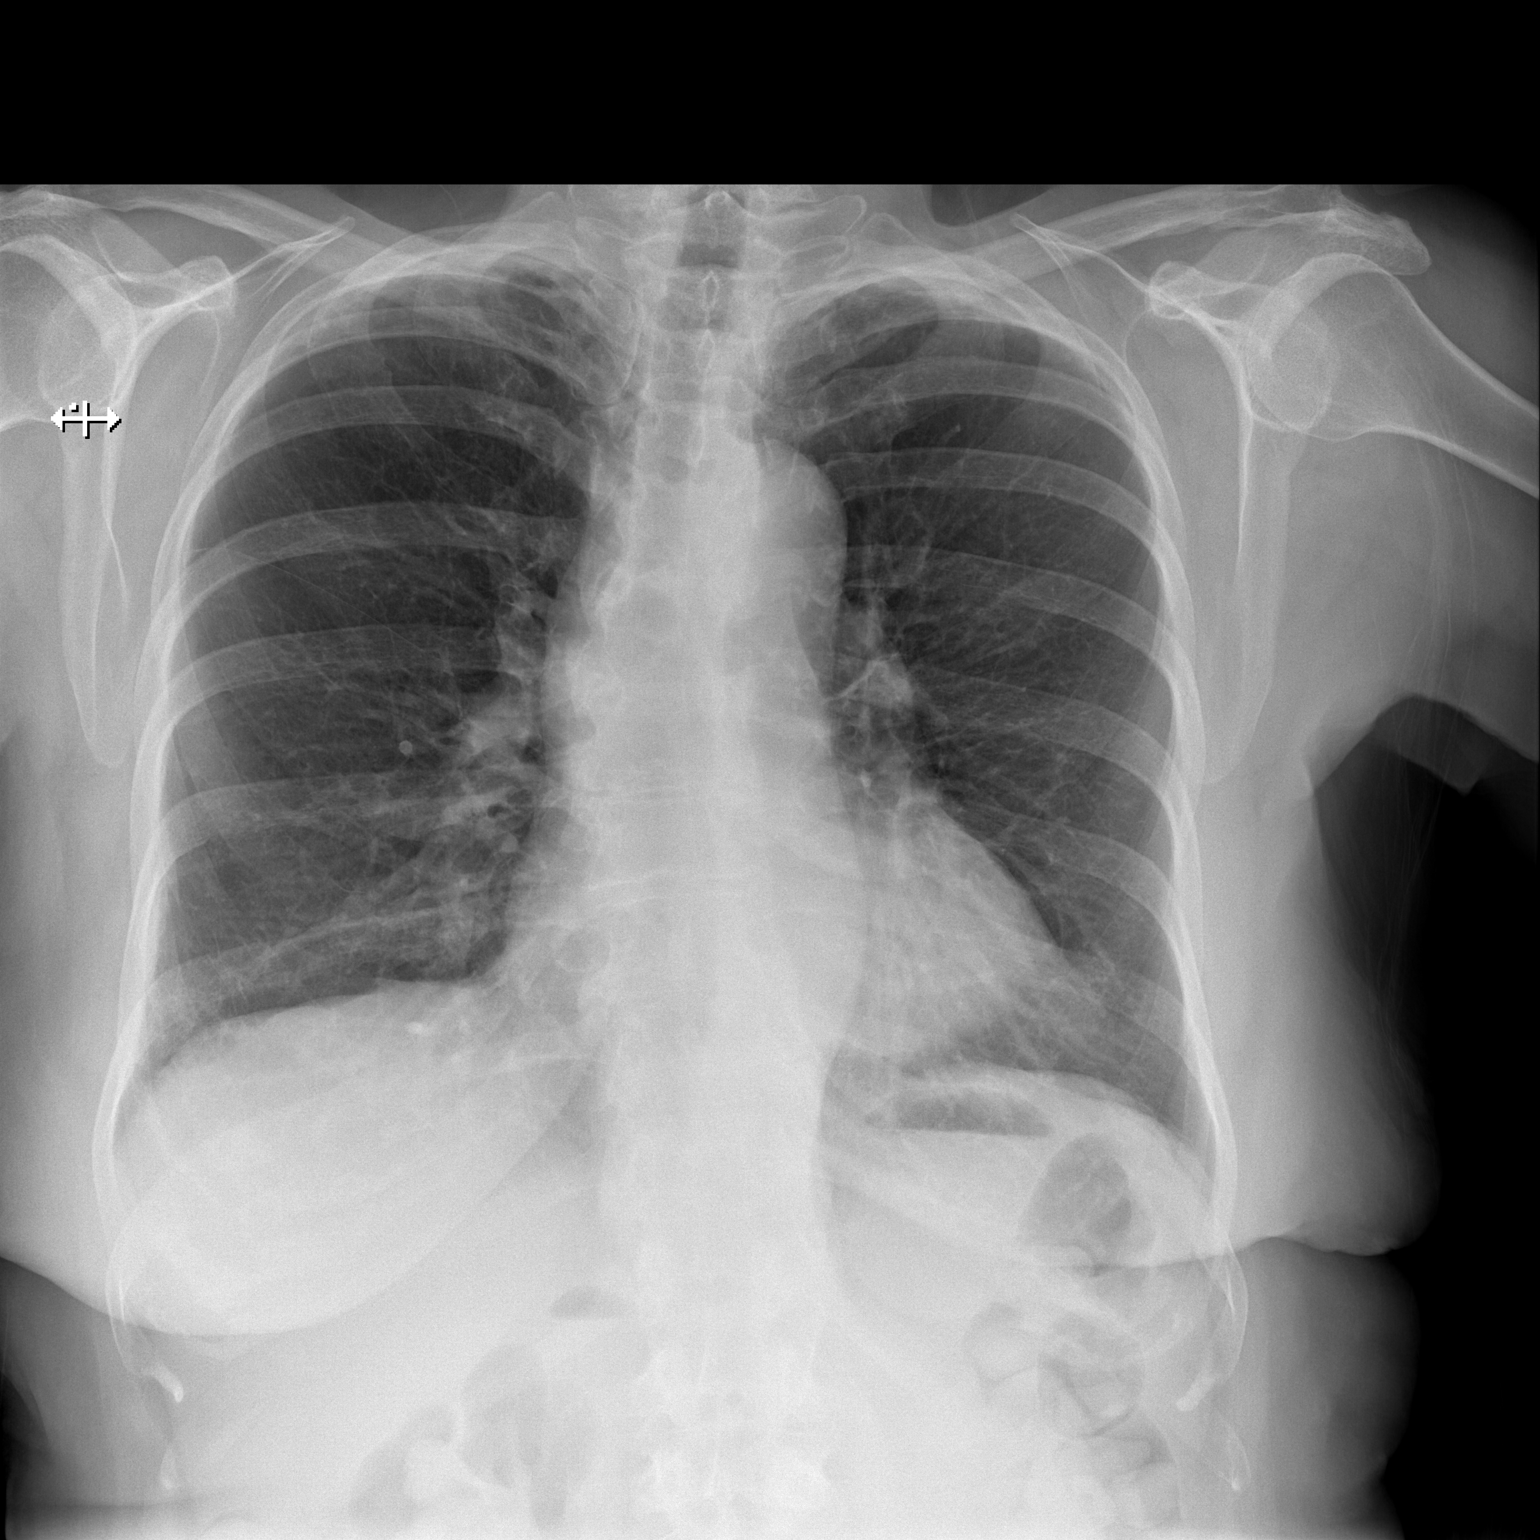

[w chest lat]
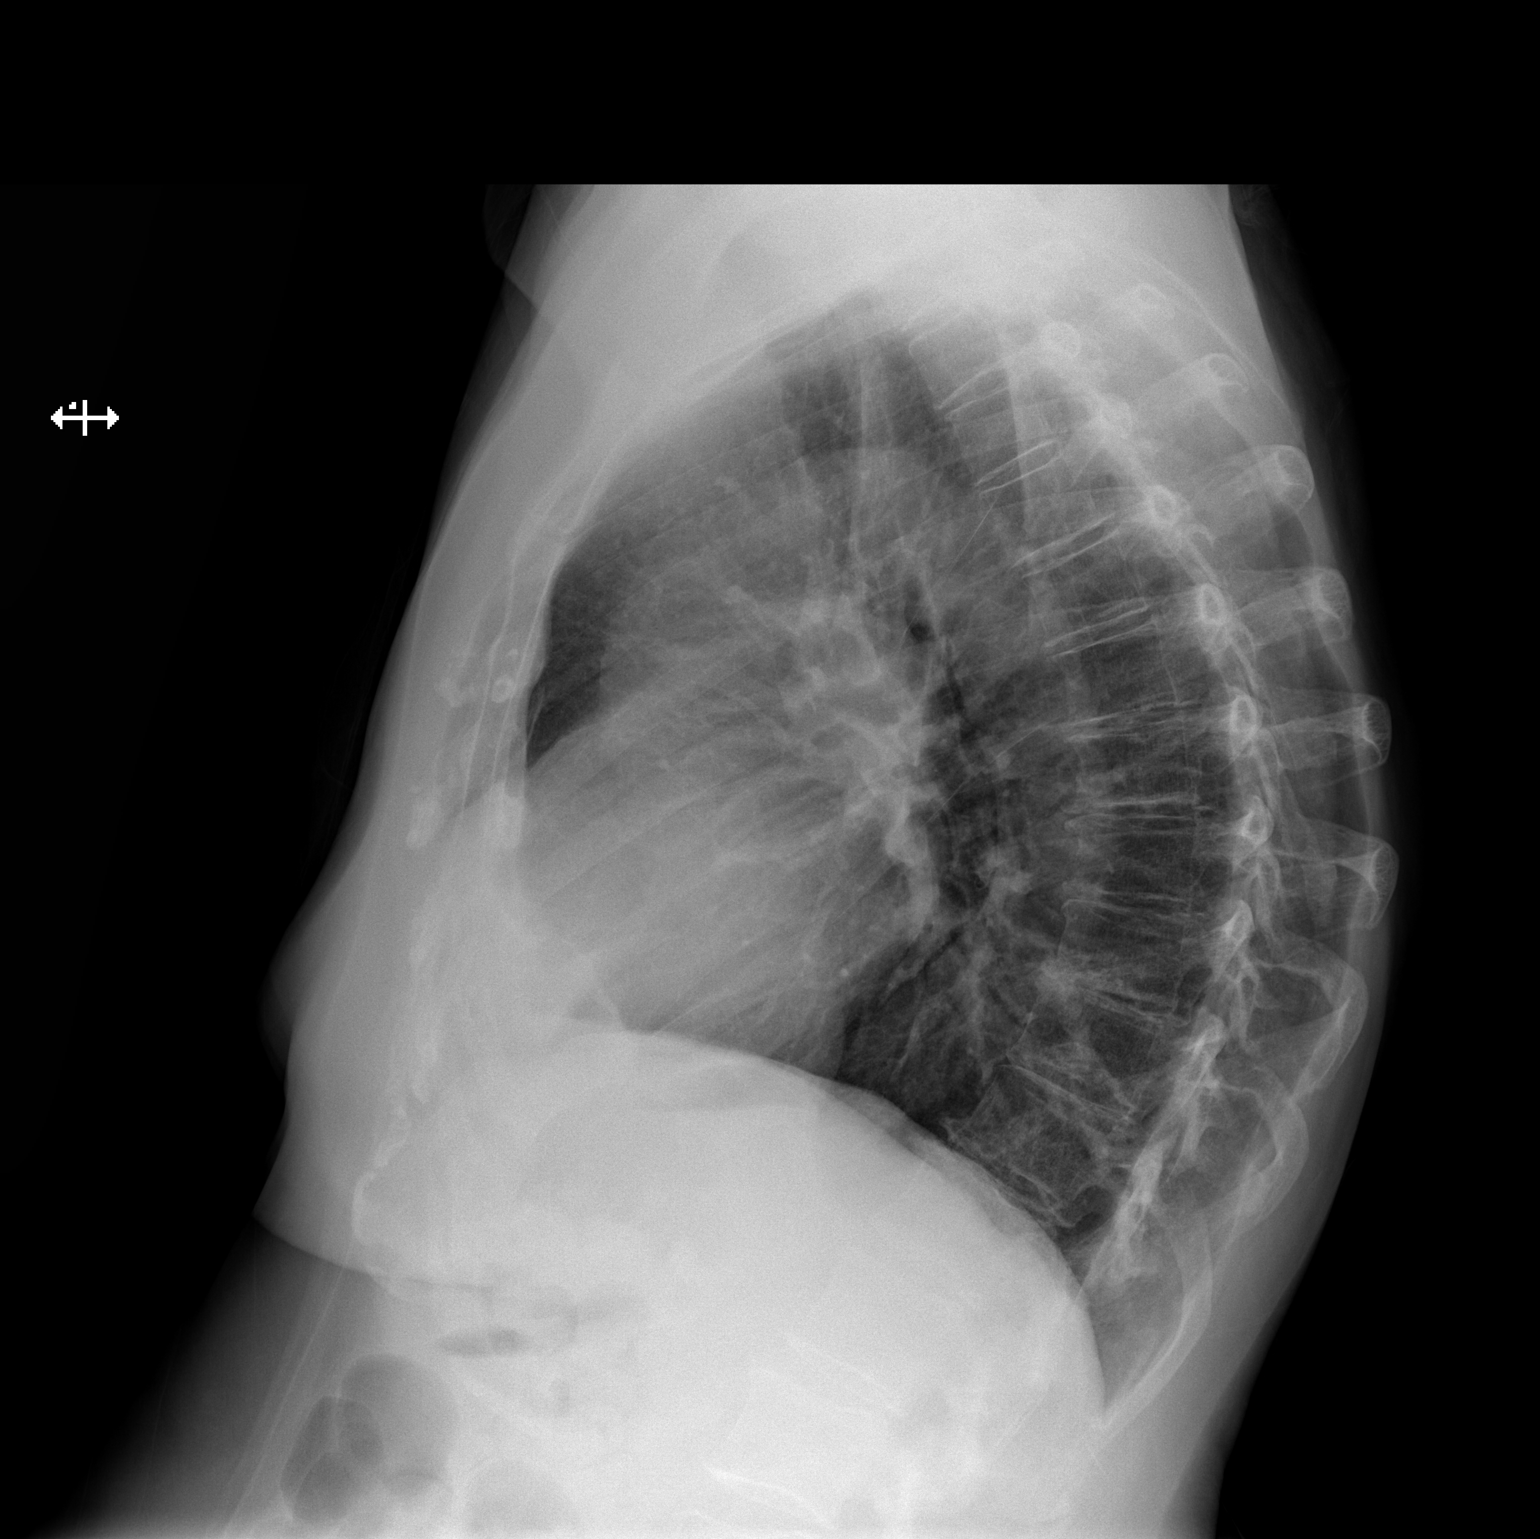

[2 of 2 positions shown; findings below may reference images not displayed]

FINDINGS: There is no edema or consolidation. The heart size and pulmonary
vascularity are normal. No adenopathy. There is degenerative change
in the thoracic spine. There are no blastic or lytic bone lesions.
Postoperative change in the left breast region is noted.
IMPRESSION: No edema or consolidation.  No adenopathy.

## 2017-03-07 DIAGNOSIS — C50812 Malignant neoplasm of overlapping sites of left female breast: Secondary | ICD-10-CM | POA: Insufficient documentation

## 2017-03-07 DIAGNOSIS — Z170 Estrogen receptor positive status [ER+]: Secondary | ICD-10-CM

## 2017-03-07 DIAGNOSIS — Z79810 Long term (current) use of selective estrogen receptor modulators (SERMs): Secondary | ICD-10-CM | POA: Insufficient documentation

## 2017-03-07 HISTORY — DX: Long term (current) use of selective estrogen receptor modulators (serms): Z79.810

## 2017-03-07 HISTORY — DX: Malignant neoplasm of overlapping sites of left female breast: Z17.0

## 2017-03-07 HISTORY — DX: Malignant neoplasm of overlapping sites of left female breast: C50.812

## 2017-05-11 DIAGNOSIS — E041 Nontoxic single thyroid nodule: Secondary | ICD-10-CM

## 2017-05-11 HISTORY — DX: Nontoxic single thyroid nodule: E04.1

## 2018-09-19 DIAGNOSIS — D34 Benign neoplasm of thyroid gland: Secondary | ICD-10-CM | POA: Diagnosis not present

## 2018-09-19 DIAGNOSIS — C50812 Malignant neoplasm of overlapping sites of left female breast: Secondary | ICD-10-CM | POA: Diagnosis not present

## 2018-09-19 DIAGNOSIS — Z170 Estrogen receptor positive status [ER+]: Secondary | ICD-10-CM | POA: Diagnosis not present

## 2018-12-27 DIAGNOSIS — E785 Hyperlipidemia, unspecified: Secondary | ICD-10-CM | POA: Diagnosis not present

## 2018-12-27 DIAGNOSIS — E559 Vitamin D deficiency, unspecified: Secondary | ICD-10-CM | POA: Diagnosis not present

## 2018-12-27 DIAGNOSIS — G2581 Restless legs syndrome: Secondary | ICD-10-CM | POA: Diagnosis not present

## 2018-12-27 DIAGNOSIS — Z0000 Encounter for general adult medical examination without abnormal findings: Secondary | ICD-10-CM | POA: Diagnosis not present

## 2018-12-27 DIAGNOSIS — R739 Hyperglycemia, unspecified: Secondary | ICD-10-CM | POA: Diagnosis not present

## 2018-12-27 DIAGNOSIS — G43709 Chronic migraine without aura, not intractable, without status migrainosus: Secondary | ICD-10-CM | POA: Diagnosis not present

## 2018-12-27 DIAGNOSIS — M159 Polyosteoarthritis, unspecified: Secondary | ICD-10-CM | POA: Diagnosis not present

## 2019-03-01 DIAGNOSIS — R928 Other abnormal and inconclusive findings on diagnostic imaging of breast: Secondary | ICD-10-CM | POA: Diagnosis not present

## 2019-03-01 DIAGNOSIS — Z853 Personal history of malignant neoplasm of breast: Secondary | ICD-10-CM | POA: Diagnosis not present

## 2019-03-05 DIAGNOSIS — X58XXXD Exposure to other specified factors, subsequent encounter: Secondary | ICD-10-CM | POA: Diagnosis not present

## 2019-03-05 DIAGNOSIS — M1460 Charcot's joint, unspecified site: Secondary | ICD-10-CM | POA: Diagnosis not present

## 2019-03-05 DIAGNOSIS — M19072 Primary osteoarthritis, left ankle and foot: Secondary | ICD-10-CM | POA: Diagnosis not present

## 2019-03-05 DIAGNOSIS — S92242P Displaced fracture of medial cuneiform of left foot, subsequent encounter for fracture with malunion: Secondary | ICD-10-CM | POA: Diagnosis not present

## 2019-03-05 DIAGNOSIS — M7732 Calcaneal spur, left foot: Secondary | ICD-10-CM | POA: Diagnosis not present

## 2019-03-05 DIAGNOSIS — M2142 Flat foot [pes planus] (acquired), left foot: Secondary | ICD-10-CM | POA: Diagnosis not present

## 2019-03-12 DIAGNOSIS — Z961 Presence of intraocular lens: Secondary | ICD-10-CM | POA: Diagnosis not present

## 2019-03-12 DIAGNOSIS — H524 Presbyopia: Secondary | ICD-10-CM | POA: Diagnosis not present

## 2019-03-12 DIAGNOSIS — H5203 Hypermetropia, bilateral: Secondary | ICD-10-CM | POA: Diagnosis not present

## 2019-03-12 DIAGNOSIS — H18593 Other hereditary corneal dystrophies, bilateral: Secondary | ICD-10-CM | POA: Diagnosis not present

## 2019-03-26 DIAGNOSIS — D649 Anemia, unspecified: Secondary | ICD-10-CM | POA: Diagnosis not present

## 2019-03-26 DIAGNOSIS — Z79899 Other long term (current) drug therapy: Secondary | ICD-10-CM | POA: Diagnosis not present

## 2019-03-26 DIAGNOSIS — Z170 Estrogen receptor positive status [ER+]: Secondary | ICD-10-CM | POA: Diagnosis not present

## 2019-03-26 DIAGNOSIS — C50812 Malignant neoplasm of overlapping sites of left female breast: Secondary | ICD-10-CM | POA: Diagnosis not present

## 2019-03-27 DIAGNOSIS — D3612 Benign neoplasm of peripheral nerves and autonomic nervous system, upper limb, including shoulder: Secondary | ICD-10-CM | POA: Diagnosis not present

## 2019-03-27 DIAGNOSIS — D485 Neoplasm of uncertain behavior of skin: Secondary | ICD-10-CM | POA: Diagnosis not present

## 2019-03-27 DIAGNOSIS — D225 Melanocytic nevi of trunk: Secondary | ICD-10-CM | POA: Diagnosis not present

## 2019-03-27 DIAGNOSIS — C44612 Basal cell carcinoma of skin of right upper limb, including shoulder: Secondary | ICD-10-CM | POA: Diagnosis not present

## 2019-03-27 DIAGNOSIS — L821 Other seborrheic keratosis: Secondary | ICD-10-CM | POA: Diagnosis not present

## 2019-03-27 DIAGNOSIS — L723 Sebaceous cyst: Secondary | ICD-10-CM | POA: Diagnosis not present

## 2019-04-04 DIAGNOSIS — M1460 Charcot's joint, unspecified site: Secondary | ICD-10-CM | POA: Diagnosis not present

## 2019-04-04 DIAGNOSIS — X58XXXD Exposure to other specified factors, subsequent encounter: Secondary | ICD-10-CM | POA: Diagnosis not present

## 2019-04-04 DIAGNOSIS — S92242P Displaced fracture of medial cuneiform of left foot, subsequent encounter for fracture with malunion: Secondary | ICD-10-CM | POA: Diagnosis not present

## 2019-04-13 DIAGNOSIS — R350 Frequency of micturition: Secondary | ICD-10-CM | POA: Diagnosis not present

## 2019-04-16 DIAGNOSIS — Z853 Personal history of malignant neoplasm of breast: Secondary | ICD-10-CM | POA: Diagnosis not present

## 2019-04-16 DIAGNOSIS — M159 Polyosteoarthritis, unspecified: Secondary | ICD-10-CM | POA: Diagnosis not present

## 2019-04-16 DIAGNOSIS — I10 Essential (primary) hypertension: Secondary | ICD-10-CM | POA: Diagnosis not present

## 2019-04-16 DIAGNOSIS — G43709 Chronic migraine without aura, not intractable, without status migrainosus: Secondary | ICD-10-CM | POA: Diagnosis not present

## 2019-05-11 DIAGNOSIS — L905 Scar conditions and fibrosis of skin: Secondary | ICD-10-CM | POA: Diagnosis not present

## 2019-05-11 DIAGNOSIS — C44612 Basal cell carcinoma of skin of right upper limb, including shoulder: Secondary | ICD-10-CM | POA: Diagnosis not present

## 2019-05-18 DIAGNOSIS — R350 Frequency of micturition: Secondary | ICD-10-CM | POA: Diagnosis not present

## 2019-07-05 ENCOUNTER — Encounter: Payer: Self-pay | Admitting: Sports Medicine

## 2019-07-05 ENCOUNTER — Other Ambulatory Visit: Payer: Self-pay

## 2019-07-05 ENCOUNTER — Ambulatory Visit (INDEPENDENT_AMBULATORY_CARE_PROVIDER_SITE_OTHER): Payer: Medicare Other | Admitting: Sports Medicine

## 2019-07-05 DIAGNOSIS — L600 Ingrowing nail: Secondary | ICD-10-CM | POA: Diagnosis not present

## 2019-07-05 DIAGNOSIS — M79674 Pain in right toe(s): Secondary | ICD-10-CM

## 2019-07-05 MED ORDER — NEOMYCIN-POLYMYXIN-HC 3.5-10000-1 OT SOLN: OTIC | 0 refills | Status: DC

## 2019-07-05 NOTE — Progress Notes (Signed)
Subjective: Whitney Campbell is a 84 y.o. female patient presents to office today complaining of a moderately painful incurvated, red, hot, swollen lateral nail border of the first toe on the right foot. This has been present for 1-1/2 weeks. Patient has treated this by self trimming but made it worse and made it bleed. Patient denies fever/chills/nausea/vomitting/any other related constitutional symptoms at this time.  Review of Systems  All other systems reviewed and are negative.    Patient Active Problem List   Diagnosis Date Noted  . Thyroid nodule 05/11/2017  . Long-term current use of tamoxifen 03/07/2017  . Malignant neoplasm of overlapping sites of left breast in female, estrogen receptor positive ([place]) 03/07/2017  . S/P arthroscopy of shoulder 05/25/2016  . Chronic right shoulder pain 03/04/2016  . Use of tamoxifen (Nolvadex) 02/19/2016  . S/P total knee replacement 11/10/2015  . Acid reflux 12/08/2013  . Adaptive colitis 12/08/2013  . Adiposity 12/08/2013  . Allergic rhinitis 12/08/2013  . Arm swelling 12/08/2013  . Arthritis, degenerative 12/08/2013  . Avitaminosis D 12/08/2013  . Blood pressure elevated without history of HTN 12/08/2013  . BP (high blood pressure) 12/08/2013  . Breast lump 12/08/2013  . Bursitis 12/08/2013  . Calcaneal spur 12/08/2013  . Calcium blood increased 12/08/2013  . Cephalalgia 12/08/2013  . Clinical depression 12/08/2013  . Closed fracture of distal end of radius 12/08/2013  . Contusion of shoulder 12/08/2013  . Difficulty hearing 12/08/2013  . Disturbance in sleep behavior 12/08/2013  . Dizziness 12/08/2013  . Ecchymosis 12/08/2013  . Elevated fasting blood sugar 12/08/2013  . Feeling bilious 12/08/2013  . Female genuine stress incontinence 12/08/2013  . Goiter, nontoxic, multinodular 12/08/2013  . Arthralgia of hip or thigh 12/08/2013  . Headache, migraine 12/08/2013  . HLD (hyperlipidemia) 12/08/2013  . Intrinsic sphincter  deficiency 12/08/2013  . LBP (low back pain) 12/08/2013  . Malignant neoplasm of female breast ([place]) 12/08/2013  . Menopausal symptom 12/08/2013  . OP (osteoporosis) 12/08/2013  . Pain in the wrist 12/08/2013  . Thoracic and lumbosacral neuritis 12/08/2013    Current Outpatient Medications on File Prior to Visit  Medication Sig Dispense Refill  . Ascorbic Acid (VITAMIN C) 100 MG tablet Take 100 mg by mouth daily.    . Biotin 5000 MCG TABS Take 5,000 mcg by mouth daily. Reported on 10/27/2015    . Cinnamon 500 MG capsule Take 500 mg by mouth daily as needed (for supplementation).     . Coconut Oil 1000 MG CAPS Take 1,000 mg by mouth daily.     . Cranberry Extract 250 MG TABS Take 250 mg by mouth daily.     Marland Kitchen DHEA 25 MG CAPS Take 25 mg by mouth daily.     . magnesium gluconate (MAGONATE) 500 MG tablet Take 250 mg by mouth 2 (two) times daily.    . Multiple Vitamins-Minerals (PRESERVISION AREDS 2 PO) Take 1 tablet by mouth 2 (two) times daily.    . rizatriptan (MAXALT) 10 MG tablet Take 10 mg by mouth as needed. May repeat in 2 hours if needed    . rOPINIRole (REQUIP) 3 MG tablet Take 3 mg by mouth at bedtime.     . tamoxifen (NOLVADEX) 20 MG tablet Take 20 mg by mouth daily.     . vitamin B-12 (CYANOCOBALAMIN) 1000 MCG tablet Take 2,500 mcg by mouth daily.     . vitamin E 400 UNIT capsule Take 400 Units by mouth daily.    Marland Kitchen zinc gluconate  50 MG tablet Take 50 mg by mouth daily.    Marland Kitchen zolpidem (AMBIEN) 5 MG tablet Take 5 mg by mouth at bedtime.     No current facility-administered medications on file prior to visit.    Allergies  Allergen Reactions  . Requip [Ropinirole Hcl] Other (See Comments)    Hallucinations at 4 mg strength  . Morphine And Related Nausea And Vomiting    Objective:  There were no vitals filed for this visit.  General: Well developed, nourished, in no acute distress, alert and oriented x3   Dermatology: Skin is warm, dry and supple bilateral.  Right hallux  nail appears to be  severely incurvated with hyperkeratosis formation at the distal aspects of  the lateral nail border. (+) Erythema. (+) Edema. (-) serosanguous  drainage present. The remaining nails are thickened consistent with mycosis without any acute signs of infection.   Vascular: Dorsalis Pedis artery and Posterior Tibial artery pedal pulses are 1/4 bilateral with immedate capillary fill time.  Varicosities present bilateral.  Diminished pedal hair growth present. No lower extremity edema.   Neruologic: Grossly intact via light touch bilateral.  However patient admits to a history of neuropathy.  Musculoskeletal: Tenderness to palpation of the right hallux lateral nail fold.  Muscular strength within normal limits in all groups bilateral.   Assesement and Plan: Problem List Items Addressed This Visit    None    Visit Diagnoses    Ingrown nail    -  Primary   Toe pain, right          -Discussed treatment alternatives and plan of care; Explained permanent/temporary nail avulsion and post procedure course to patient. Patient elects for permanent nail avulsion right hallux lateral margin - After a verbal and written consent, injected 3 ml of a 50:50 mixture of 2% plain  lidocaine and 0.5% plain marcaine in a normal hallux block fashion. Next, a  betadine prep was performed. Anesthesia was tested and found to be appropriate.  The offending right hallux lateral nail border was then incised from the hyponychium to the epinychium. The offending nail border was removed and cleared from the field. The area was curretted for any remaining nail or spicules. Phenol application performed and the area was then flushed with alcohol and dressed with antibiotic cream and a dry sterile dressing. -Patient was instructed to leave the dressing intact for today and begin soaking  in a weak solution of betadine or Epsom salt and water tomorrow. Patient was instructed to  soak for 15-20 minutes each day  and apply neosporin/corticosporin and a gauze or bandaid dressing each day. -Patient was instructed to monitor the toe for signs of infection and return to office if toe becomes red, hot or swollen. -Advised ice, elevation, and tylenol or motrin if needed for pain.  -Patient is to return in 2 weeks for follow up care/nail check or sooner if problems arise.  Landis Martins, DPM

## 2019-07-05 NOTE — Patient Instructions (Signed)

## 2019-07-18 ENCOUNTER — Ambulatory Visit (INDEPENDENT_AMBULATORY_CARE_PROVIDER_SITE_OTHER): Payer: Medicare Other | Admitting: Sports Medicine

## 2019-07-18 ENCOUNTER — Encounter: Payer: Self-pay | Admitting: Sports Medicine

## 2019-07-18 ENCOUNTER — Other Ambulatory Visit: Payer: Self-pay

## 2019-07-18 DIAGNOSIS — Z9889 Other specified postprocedural states: Secondary | ICD-10-CM

## 2019-07-18 DIAGNOSIS — M79674 Pain in right toe(s): Secondary | ICD-10-CM

## 2019-07-18 DIAGNOSIS — L600 Ingrowing nail: Secondary | ICD-10-CM

## 2019-07-18 NOTE — Progress Notes (Signed)
Subjective: Whitney Campbell is a 84 y.o. female patient returns to office today for follow up evaluation after having Right Hallux lateral permanent nail avulsion performed on (07-05-19). Patient has been soaking using epsom salt and applying topical antibiotic covered with bandaid daily. Patient admits some redness and bleeding each time she soaks, deniesfever/chills/nausea/vomitting/any other related constitutional symptoms at this time.  Patient Active Problem List   Diagnosis Date Noted  . Thyroid nodule 05/11/2017  . Long-term current use of tamoxifen 03/07/2017  . Malignant neoplasm of overlapping sites of left breast in female, estrogen receptor positive ([place]) 03/07/2017  . S/P arthroscopy of shoulder 05/25/2016  . Chronic right shoulder pain 03/04/2016  . Use of tamoxifen (Nolvadex) 02/19/2016  . S/P total knee replacement 11/10/2015  . Acid reflux 12/08/2013  . Adaptive colitis 12/08/2013  . Adiposity 12/08/2013  . Allergic rhinitis 12/08/2013  . Arm swelling 12/08/2013  . Arthritis, degenerative 12/08/2013  . Avitaminosis D 12/08/2013  . Blood pressure elevated without history of HTN 12/08/2013  . BP (high blood pressure) 12/08/2013  . Breast lump 12/08/2013  . Bursitis 12/08/2013  . Calcaneal spur 12/08/2013  . Calcium blood increased 12/08/2013  . Cephalalgia 12/08/2013  . Clinical depression 12/08/2013  . Closed fracture of distal end of radius 12/08/2013  . Contusion of shoulder 12/08/2013  . Difficulty hearing 12/08/2013  . Disturbance in sleep behavior 12/08/2013  . Dizziness 12/08/2013  . Ecchymosis 12/08/2013  . Elevated fasting blood sugar 12/08/2013  . Feeling bilious 12/08/2013  . Female genuine stress incontinence 12/08/2013  . Goiter, nontoxic, multinodular 12/08/2013  . Arthralgia of hip or thigh 12/08/2013  . Headache, migraine 12/08/2013  . HLD (hyperlipidemia) 12/08/2013  . Intrinsic sphincter deficiency 12/08/2013  . LBP (low back pain) 12/08/2013   . Malignant neoplasm of female breast ([place]) 12/08/2013  . Menopausal symptom 12/08/2013  . OP (osteoporosis) 12/08/2013  . Pain in the wrist 12/08/2013  . Thoracic and lumbosacral neuritis 12/08/2013    Current Outpatient Medications on File Prior to Visit  Medication Sig Dispense Refill  . amLODipine (NORVASC) 10 MG tablet Take 10 mg by mouth daily.    . Ascorbic Acid (VITAMIN C) 100 MG tablet Take 100 mg by mouth daily.    . Biotin 5000 MCG TABS Take 5,000 mcg by mouth daily. Reported on 10/27/2015    . Cinnamon 500 MG capsule Take 500 mg by mouth daily as needed (for supplementation).     . Coconut Oil 1000 MG CAPS Take 1,000 mg by mouth daily.     . Cranberry Extract 250 MG TABS Take 250 mg by mouth daily.     Marland Kitchen DHEA 25 MG CAPS Take 25 mg by mouth daily.     Marland Kitchen ibuprofen (ADVIL) 800 MG tablet Take 800 mg by mouth every 8 (eight) hours as needed.    . magnesium gluconate (MAGONATE) 500 MG tablet Take 250 mg by mouth 2 (two) times daily.    . Multiple Vitamins-Minerals (PRESERVISION AREDS 2 PO) Take 1 tablet by mouth 2 (two) times daily.    Marland Kitchen neomycin-polymyxin-hydrocortisone (CORTISPORIN) OTIC solution Apply 2-3 drops to the ingrown toenail site twice daily. Cover with band-aid. 10 mL 0  . orphenadrine (NORFLEX) 30 MG/ML injection     . rizatriptan (MAXALT) 10 MG tablet Take 10 mg by mouth as needed. May repeat in 2 hours if needed    . rOPINIRole (REQUIP) 3 MG tablet Take 3 mg by mouth at bedtime.     . tamoxifen (  NOLVADEX) 20 MG tablet Take 20 mg by mouth daily.     . vitamin B-12 (CYANOCOBALAMIN) 1000 MCG tablet Take 2,500 mcg by mouth daily.     . vitamin E 400 UNIT capsule Take 400 Units by mouth daily.    Marland Kitchen zinc gluconate 50 MG tablet Take 50 mg by mouth daily.    Marland Kitchen zolpidem (AMBIEN) 5 MG tablet Take 5 mg by mouth at bedtime.     No current facility-administered medications on file prior to visit.    Allergies  Allergen Reactions  . Requip [Ropinirole Hcl] Other (See  Comments)    Hallucinations at 4 mg strength  . Morphine And Related Nausea And Vomiting    Objective:  General: Well developed, nourished, in no acute distress, alert and oriented x3   Dermatology: Skin is warm, dry and supple bilateral. Right hallux lateral nail bed appears to be clean, dry, with mild granular tissue and surrounding eschar/scab. (+) Blanchable Erythema. (-) Edema. (-) serosanguous drainage present. The remaining nails appear unremarkable at this time. There are no other lesions or other signs of infection  present.  Neurovascular status: Intact. No lower extremity swelling; No pain with calf compression bilateral.  Musculoskeletal: Decreased tenderness to palpation of the R hallux lateral nail fold. Muscular strength within normal limits bilateral.   Assesement and Plan: Problem List Items Addressed This Visit    None    Visit Diagnoses    S/P nail surgery    -  Primary   Ingrown nail       Toe pain, right          -Examined patient  -Cleansed right hallux lateral nail fold and gently scrubbed with peroxide and q-tip/curetted away eschar at site and applied antibiotic cream covered with bandaid.  -Discussed plan of care with patient. -Patient to now begin soaking in a weak solution of Epsom salt and warm water. Patient was instructed to soak for 15-20 minutes each day until the toe appears normal and there is no drainage, redness, tenderness, or swelling at the procedure site, and apply neosporin and a gauze or bandaid dressing each day as needed for at least 1 more week. May leave open to air at night. -Educated patient on long term care after nail surgery. -Patient was instructed to monitor the toe for reoccurrence and signs of infection; Patient advised to return to office or go to ER if toe becomes red, hot or swollen. -Patient is to return as needed or sooner if problems arise.  Landis Martins, DPM

## 2020-03-06 DIAGNOSIS — Z170 Estrogen receptor positive status [ER+]: Secondary | ICD-10-CM | POA: Diagnosis not present

## 2020-03-06 DIAGNOSIS — D649 Anemia, unspecified: Secondary | ICD-10-CM | POA: Diagnosis not present

## 2020-03-06 DIAGNOSIS — C50812 Malignant neoplasm of overlapping sites of left female breast: Secondary | ICD-10-CM | POA: Diagnosis not present

## 2020-03-06 DIAGNOSIS — E041 Nontoxic single thyroid nodule: Secondary | ICD-10-CM | POA: Diagnosis not present

## 2020-03-06 DIAGNOSIS — Z1231 Encounter for screening mammogram for malignant neoplasm of breast: Secondary | ICD-10-CM | POA: Diagnosis not present

## 2020-04-09 DIAGNOSIS — N390 Urinary tract infection, site not specified: Secondary | ICD-10-CM | POA: Diagnosis not present

## 2020-04-09 DIAGNOSIS — N3281 Overactive bladder: Secondary | ICD-10-CM | POA: Diagnosis not present

## 2020-04-09 DIAGNOSIS — R3129 Other microscopic hematuria: Secondary | ICD-10-CM | POA: Diagnosis not present

## 2020-04-28 DIAGNOSIS — R109 Unspecified abdominal pain: Secondary | ICD-10-CM | POA: Diagnosis not present

## 2020-04-28 DIAGNOSIS — K4090 Unilateral inguinal hernia, without obstruction or gangrene, not specified as recurrent: Secondary | ICD-10-CM | POA: Diagnosis not present

## 2020-04-28 DIAGNOSIS — K810 Acute cholecystitis: Secondary | ICD-10-CM | POA: Diagnosis not present

## 2020-04-28 DIAGNOSIS — N281 Cyst of kidney, acquired: Secondary | ICD-10-CM | POA: Diagnosis not present

## 2020-04-28 DIAGNOSIS — K828 Other specified diseases of gallbladder: Secondary | ICD-10-CM | POA: Diagnosis not present

## 2020-04-28 DIAGNOSIS — F32A Depression, unspecified: Secondary | ICD-10-CM | POA: Diagnosis not present

## 2020-04-28 DIAGNOSIS — J984 Other disorders of lung: Secondary | ICD-10-CM | POA: Diagnosis not present

## 2020-04-28 DIAGNOSIS — C50919 Malignant neoplasm of unspecified site of unspecified female breast: Secondary | ICD-10-CM | POA: Diagnosis not present

## 2020-04-28 DIAGNOSIS — K8000 Calculus of gallbladder with acute cholecystitis without obstruction: Secondary | ICD-10-CM | POA: Diagnosis not present

## 2020-04-28 DIAGNOSIS — R1013 Epigastric pain: Secondary | ICD-10-CM | POA: Diagnosis not present

## 2020-04-28 DIAGNOSIS — K8020 Calculus of gallbladder without cholecystitis without obstruction: Secondary | ICD-10-CM | POA: Diagnosis not present

## 2020-04-28 DIAGNOSIS — Z79899 Other long term (current) drug therapy: Secondary | ICD-10-CM | POA: Diagnosis not present

## 2020-04-28 DIAGNOSIS — K8012 Calculus of gallbladder with acute and chronic cholecystitis without obstruction: Secondary | ICD-10-CM | POA: Diagnosis not present

## 2020-04-28 DIAGNOSIS — K7689 Other specified diseases of liver: Secondary | ICD-10-CM | POA: Diagnosis not present

## 2020-04-28 DIAGNOSIS — R1010 Upper abdominal pain, unspecified: Secondary | ICD-10-CM | POA: Diagnosis not present

## 2020-04-29 DIAGNOSIS — K810 Acute cholecystitis: Secondary | ICD-10-CM | POA: Diagnosis not present

## 2020-05-23 DIAGNOSIS — R6889 Other general symptoms and signs: Secondary | ICD-10-CM | POA: Diagnosis not present

## 2020-06-04 DIAGNOSIS — N3281 Overactive bladder: Secondary | ICD-10-CM | POA: Diagnosis not present

## 2020-06-05 DIAGNOSIS — Z961 Presence of intraocular lens: Secondary | ICD-10-CM | POA: Diagnosis not present

## 2020-06-05 DIAGNOSIS — H5203 Hypermetropia, bilateral: Secondary | ICD-10-CM | POA: Diagnosis not present

## 2020-06-05 DIAGNOSIS — H26492 Other secondary cataract, left eye: Secondary | ICD-10-CM | POA: Diagnosis not present

## 2020-06-18 DIAGNOSIS — N3281 Overactive bladder: Secondary | ICD-10-CM | POA: Diagnosis not present

## 2020-08-28 DIAGNOSIS — R7301 Impaired fasting glucose: Secondary | ICD-10-CM | POA: Diagnosis not present

## 2020-08-28 DIAGNOSIS — Z6824 Body mass index (BMI) 24.0-24.9, adult: Secondary | ICD-10-CM | POA: Diagnosis not present

## 2020-08-28 DIAGNOSIS — Z0000 Encounter for general adult medical examination without abnormal findings: Secondary | ICD-10-CM | POA: Diagnosis not present

## 2020-08-28 DIAGNOSIS — C50912 Malignant neoplasm of unspecified site of left female breast: Secondary | ICD-10-CM | POA: Diagnosis not present

## 2020-08-28 DIAGNOSIS — M1991 Primary osteoarthritis, unspecified site: Secondary | ICD-10-CM | POA: Diagnosis not present

## 2020-08-28 DIAGNOSIS — E785 Hyperlipidemia, unspecified: Secondary | ICD-10-CM | POA: Diagnosis not present

## 2020-08-28 DIAGNOSIS — Z79899 Other long term (current) drug therapy: Secondary | ICD-10-CM | POA: Diagnosis not present

## 2020-08-28 DIAGNOSIS — Z9181 History of falling: Secondary | ICD-10-CM | POA: Diagnosis not present

## 2020-08-28 DIAGNOSIS — G43709 Chronic migraine without aura, not intractable, without status migrainosus: Secondary | ICD-10-CM | POA: Diagnosis not present

## 2020-08-28 DIAGNOSIS — I10 Essential (primary) hypertension: Secondary | ICD-10-CM | POA: Diagnosis not present

## 2020-08-28 DIAGNOSIS — N3281 Overactive bladder: Secondary | ICD-10-CM | POA: Diagnosis not present

## 2020-09-03 DIAGNOSIS — N3281 Overactive bladder: Secondary | ICD-10-CM | POA: Diagnosis not present

## 2020-09-03 DIAGNOSIS — R3581 Nocturnal polyuria: Secondary | ICD-10-CM | POA: Diagnosis not present

## 2020-09-03 DIAGNOSIS — R600 Localized edema: Secondary | ICD-10-CM | POA: Diagnosis not present

## 2020-09-23 DIAGNOSIS — D519 Vitamin B12 deficiency anemia, unspecified: Secondary | ICD-10-CM | POA: Diagnosis not present

## 2020-09-25 DIAGNOSIS — D485 Neoplasm of uncertain behavior of skin: Secondary | ICD-10-CM | POA: Diagnosis not present

## 2020-09-25 DIAGNOSIS — C44311 Basal cell carcinoma of skin of nose: Secondary | ICD-10-CM | POA: Diagnosis not present

## 2020-10-07 DIAGNOSIS — D519 Vitamin B12 deficiency anemia, unspecified: Secondary | ICD-10-CM | POA: Diagnosis not present

## 2020-10-09 DIAGNOSIS — M7062 Trochanteric bursitis, left hip: Secondary | ICD-10-CM | POA: Diagnosis not present

## 2020-10-09 DIAGNOSIS — M11259 Other chondrocalcinosis, unspecified hip: Secondary | ICD-10-CM | POA: Diagnosis not present

## 2020-10-09 DIAGNOSIS — M160 Bilateral primary osteoarthritis of hip: Secondary | ICD-10-CM | POA: Diagnosis not present

## 2020-10-09 DIAGNOSIS — M7061 Trochanteric bursitis, right hip: Secondary | ICD-10-CM | POA: Diagnosis not present

## 2020-10-21 DIAGNOSIS — C50812 Malignant neoplasm of overlapping sites of left female breast: Secondary | ICD-10-CM | POA: Diagnosis not present

## 2020-10-21 DIAGNOSIS — Z853 Personal history of malignant neoplasm of breast: Secondary | ICD-10-CM | POA: Diagnosis not present

## 2020-10-21 DIAGNOSIS — D34 Benign neoplasm of thyroid gland: Secondary | ICD-10-CM | POA: Diagnosis not present

## 2020-10-21 DIAGNOSIS — Z170 Estrogen receptor positive status [ER+]: Secondary | ICD-10-CM | POA: Diagnosis not present

## 2020-10-21 DIAGNOSIS — D649 Anemia, unspecified: Secondary | ICD-10-CM | POA: Diagnosis not present

## 2020-10-21 DIAGNOSIS — Z08 Encounter for follow-up examination after completed treatment for malignant neoplasm: Secondary | ICD-10-CM | POA: Diagnosis not present

## 2020-10-21 DIAGNOSIS — Z923 Personal history of irradiation: Secondary | ICD-10-CM | POA: Diagnosis not present

## 2020-10-22 DIAGNOSIS — D519 Vitamin B12 deficiency anemia, unspecified: Secondary | ICD-10-CM | POA: Diagnosis not present

## 2020-11-11 DIAGNOSIS — M7062 Trochanteric bursitis, left hip: Secondary | ICD-10-CM | POA: Diagnosis not present

## 2020-11-11 DIAGNOSIS — M7061 Trochanteric bursitis, right hip: Secondary | ICD-10-CM | POA: Diagnosis not present

## 2020-12-24 DIAGNOSIS — N3281 Overactive bladder: Secondary | ICD-10-CM | POA: Diagnosis not present

## 2020-12-24 DIAGNOSIS — R82998 Other abnormal findings in urine: Secondary | ICD-10-CM | POA: Diagnosis not present

## 2021-01-26 DIAGNOSIS — C44311 Basal cell carcinoma of skin of nose: Secondary | ICD-10-CM | POA: Diagnosis not present

## 2021-03-09 DIAGNOSIS — Z1231 Encounter for screening mammogram for malignant neoplasm of breast: Secondary | ICD-10-CM | POA: Diagnosis not present

## 2021-04-28 DIAGNOSIS — Z96653 Presence of artificial knee joint, bilateral: Secondary | ICD-10-CM | POA: Diagnosis not present

## 2021-04-28 DIAGNOSIS — M160 Bilateral primary osteoarthritis of hip: Secondary | ICD-10-CM | POA: Diagnosis not present

## 2021-04-28 DIAGNOSIS — M7061 Trochanteric bursitis, right hip: Secondary | ICD-10-CM | POA: Diagnosis not present

## 2021-04-28 DIAGNOSIS — M7062 Trochanteric bursitis, left hip: Secondary | ICD-10-CM | POA: Diagnosis not present

## 2021-05-21 DIAGNOSIS — S92242P Displaced fracture of medial cuneiform of left foot, subsequent encounter for fracture with malunion: Secondary | ICD-10-CM | POA: Diagnosis not present

## 2021-05-21 DIAGNOSIS — M79671 Pain in right foot: Secondary | ICD-10-CM | POA: Diagnosis not present

## 2021-05-21 DIAGNOSIS — M19072 Primary osteoarthritis, left ankle and foot: Secondary | ICD-10-CM | POA: Diagnosis not present

## 2021-05-21 DIAGNOSIS — M7732 Calcaneal spur, left foot: Secondary | ICD-10-CM | POA: Diagnosis not present

## 2021-05-21 DIAGNOSIS — Q666 Other congenital valgus deformities of feet: Secondary | ICD-10-CM | POA: Diagnosis not present

## 2021-05-21 DIAGNOSIS — S92232P Displaced fracture of intermediate cuneiform of left foot, subsequent encounter for fracture with malunion: Secondary | ICD-10-CM | POA: Diagnosis not present

## 2021-05-21 DIAGNOSIS — M19071 Primary osteoarthritis, right ankle and foot: Secondary | ICD-10-CM | POA: Diagnosis not present

## 2021-05-21 DIAGNOSIS — M1460 Charcot's joint, unspecified site: Secondary | ICD-10-CM | POA: Diagnosis not present

## 2021-05-21 DIAGNOSIS — X58XXXD Exposure to other specified factors, subsequent encounter: Secondary | ICD-10-CM | POA: Diagnosis not present

## 2021-06-04 DIAGNOSIS — Z961 Presence of intraocular lens: Secondary | ICD-10-CM | POA: Diagnosis not present

## 2021-06-04 DIAGNOSIS — H5203 Hypermetropia, bilateral: Secondary | ICD-10-CM | POA: Diagnosis not present

## 2021-06-08 DIAGNOSIS — S92242P Displaced fracture of medial cuneiform of left foot, subsequent encounter for fracture with malunion: Secondary | ICD-10-CM | POA: Diagnosis not present

## 2021-06-08 DIAGNOSIS — M1460 Charcot's joint, unspecified site: Secondary | ICD-10-CM | POA: Diagnosis not present

## 2021-06-08 DIAGNOSIS — S92232P Displaced fracture of intermediate cuneiform of left foot, subsequent encounter for fracture with malunion: Secondary | ICD-10-CM | POA: Diagnosis not present

## 2021-06-08 DIAGNOSIS — X58XXXD Exposure to other specified factors, subsequent encounter: Secondary | ICD-10-CM | POA: Diagnosis not present

## 2021-06-21 DIAGNOSIS — M6281 Muscle weakness (generalized): Secondary | ICD-10-CM | POA: Diagnosis not present

## 2021-06-21 DIAGNOSIS — M19079 Primary osteoarthritis, unspecified ankle and foot: Secondary | ICD-10-CM | POA: Diagnosis not present

## 2021-06-21 DIAGNOSIS — M1991 Primary osteoarthritis, unspecified site: Secondary | ICD-10-CM | POA: Diagnosis not present

## 2021-06-21 DIAGNOSIS — R2689 Other abnormalities of gait and mobility: Secondary | ICD-10-CM | POA: Diagnosis not present

## 2021-06-25 DIAGNOSIS — N3000 Acute cystitis without hematuria: Secondary | ICD-10-CM | POA: Diagnosis not present

## 2021-08-19 DIAGNOSIS — C50912 Malignant neoplasm of unspecified site of left female breast: Secondary | ICD-10-CM | POA: Diagnosis not present

## 2021-08-19 DIAGNOSIS — M62830 Muscle spasm of back: Secondary | ICD-10-CM | POA: Diagnosis not present

## 2021-08-19 DIAGNOSIS — M1991 Primary osteoarthritis, unspecified site: Secondary | ICD-10-CM | POA: Diagnosis not present

## 2021-08-19 DIAGNOSIS — S41119A Laceration without foreign body of unspecified upper arm, initial encounter: Secondary | ICD-10-CM | POA: Diagnosis not present

## 2021-08-19 DIAGNOSIS — R0781 Pleurodynia: Secondary | ICD-10-CM | POA: Diagnosis not present

## 2021-09-01 DIAGNOSIS — Z803 Family history of malignant neoplasm of breast: Secondary | ICD-10-CM | POA: Diagnosis not present

## 2021-09-01 DIAGNOSIS — I10 Essential (primary) hypertension: Secondary | ICD-10-CM | POA: Diagnosis not present

## 2021-09-01 DIAGNOSIS — S92232P Displaced fracture of intermediate cuneiform of left foot, subsequent encounter for fracture with malunion: Secondary | ICD-10-CM | POA: Diagnosis not present

## 2021-09-01 DIAGNOSIS — G8918 Other acute postprocedural pain: Secondary | ICD-10-CM | POA: Diagnosis not present

## 2021-09-01 DIAGNOSIS — M19072 Primary osteoarthritis, left ankle and foot: Secondary | ICD-10-CM | POA: Diagnosis not present

## 2021-09-01 DIAGNOSIS — Z79899 Other long term (current) drug therapy: Secondary | ICD-10-CM | POA: Diagnosis not present

## 2021-09-01 DIAGNOSIS — S92242P Displaced fracture of medial cuneiform of left foot, subsequent encounter for fracture with malunion: Secondary | ICD-10-CM | POA: Diagnosis not present

## 2021-09-01 DIAGNOSIS — M14672 Charcot's joint, left ankle and foot: Secondary | ICD-10-CM | POA: Diagnosis not present

## 2021-09-10 DIAGNOSIS — S92242P Displaced fracture of medial cuneiform of left foot, subsequent encounter for fracture with malunion: Secondary | ICD-10-CM | POA: Diagnosis not present

## 2021-09-23 DIAGNOSIS — S92232P Displaced fracture of intermediate cuneiform of left foot, subsequent encounter for fracture with malunion: Secondary | ICD-10-CM | POA: Diagnosis not present

## 2021-09-23 DIAGNOSIS — S92242P Displaced fracture of medial cuneiform of left foot, subsequent encounter for fracture with malunion: Secondary | ICD-10-CM | POA: Diagnosis not present

## 2021-10-08 DIAGNOSIS — M14672 Charcot's joint, left ankle and foot: Secondary | ICD-10-CM | POA: Diagnosis not present

## 2021-10-08 DIAGNOSIS — S92232P Displaced fracture of intermediate cuneiform of left foot, subsequent encounter for fracture with malunion: Secondary | ICD-10-CM | POA: Diagnosis not present

## 2021-10-08 DIAGNOSIS — S92242P Displaced fracture of medial cuneiform of left foot, subsequent encounter for fracture with malunion: Secondary | ICD-10-CM | POA: Diagnosis not present

## 2021-10-08 DIAGNOSIS — Z981 Arthrodesis status: Secondary | ICD-10-CM | POA: Diagnosis not present

## 2021-10-19 DIAGNOSIS — X58XXXD Exposure to other specified factors, subsequent encounter: Secondary | ICD-10-CM | POA: Diagnosis not present

## 2021-10-19 DIAGNOSIS — S92232P Displaced fracture of intermediate cuneiform of left foot, subsequent encounter for fracture with malunion: Secondary | ICD-10-CM | POA: Diagnosis not present

## 2021-10-19 DIAGNOSIS — M1460 Charcot's joint, unspecified site: Secondary | ICD-10-CM | POA: Diagnosis not present

## 2021-10-19 DIAGNOSIS — S92242P Displaced fracture of medial cuneiform of left foot, subsequent encounter for fracture with malunion: Secondary | ICD-10-CM | POA: Diagnosis not present

## 2021-10-21 DIAGNOSIS — S92242P Displaced fracture of medial cuneiform of left foot, subsequent encounter for fracture with malunion: Secondary | ICD-10-CM | POA: Diagnosis not present

## 2021-10-21 DIAGNOSIS — T8131XD Disruption of external operation (surgical) wound, not elsewhere classified, subsequent encounter: Secondary | ICD-10-CM | POA: Diagnosis not present

## 2021-10-21 DIAGNOSIS — Z9181 History of falling: Secondary | ICD-10-CM | POA: Diagnosis not present

## 2021-10-21 DIAGNOSIS — Z853 Personal history of malignant neoplasm of breast: Secondary | ICD-10-CM | POA: Diagnosis not present

## 2021-10-21 DIAGNOSIS — M519 Unspecified thoracic, thoracolumbar and lumbosacral intervertebral disc disorder: Secondary | ICD-10-CM | POA: Diagnosis not present

## 2021-10-21 DIAGNOSIS — Z96653 Presence of artificial knee joint, bilateral: Secondary | ICD-10-CM | POA: Diagnosis not present

## 2021-10-21 DIAGNOSIS — M1460 Charcot's joint, unspecified site: Secondary | ICD-10-CM | POA: Diagnosis not present

## 2021-10-21 DIAGNOSIS — M19072 Primary osteoarthritis, left ankle and foot: Secondary | ICD-10-CM | POA: Diagnosis not present

## 2021-10-21 DIAGNOSIS — I10 Essential (primary) hypertension: Secondary | ICD-10-CM | POA: Diagnosis not present

## 2021-10-21 DIAGNOSIS — Z981 Arthrodesis status: Secondary | ICD-10-CM | POA: Diagnosis not present

## 2021-10-21 DIAGNOSIS — S92232P Displaced fracture of intermediate cuneiform of left foot, subsequent encounter for fracture with malunion: Secondary | ICD-10-CM | POA: Diagnosis not present

## 2021-10-23 DIAGNOSIS — R2689 Other abnormalities of gait and mobility: Secondary | ICD-10-CM | POA: Diagnosis not present

## 2021-10-23 DIAGNOSIS — M6281 Muscle weakness (generalized): Secondary | ICD-10-CM | POA: Diagnosis not present

## 2021-10-23 DIAGNOSIS — Z981 Arthrodesis status: Secondary | ICD-10-CM | POA: Diagnosis not present

## 2021-10-23 DIAGNOSIS — M519 Unspecified thoracic, thoracolumbar and lumbosacral intervertebral disc disorder: Secondary | ICD-10-CM | POA: Diagnosis not present

## 2021-10-23 DIAGNOSIS — Z96653 Presence of artificial knee joint, bilateral: Secondary | ICD-10-CM | POA: Diagnosis not present

## 2021-10-23 DIAGNOSIS — Z9181 History of falling: Secondary | ICD-10-CM | POA: Diagnosis not present

## 2021-10-23 DIAGNOSIS — M19079 Primary osteoarthritis, unspecified ankle and foot: Secondary | ICD-10-CM | POA: Diagnosis not present

## 2021-10-23 DIAGNOSIS — M19072 Primary osteoarthritis, left ankle and foot: Secondary | ICD-10-CM | POA: Diagnosis not present

## 2021-10-23 DIAGNOSIS — M1991 Primary osteoarthritis, unspecified site: Secondary | ICD-10-CM | POA: Diagnosis not present

## 2021-10-23 DIAGNOSIS — T8131XD Disruption of external operation (surgical) wound, not elsewhere classified, subsequent encounter: Secondary | ICD-10-CM | POA: Diagnosis not present

## 2021-10-23 DIAGNOSIS — I10 Essential (primary) hypertension: Secondary | ICD-10-CM | POA: Diagnosis not present

## 2021-10-23 DIAGNOSIS — S92242P Displaced fracture of medial cuneiform of left foot, subsequent encounter for fracture with malunion: Secondary | ICD-10-CM | POA: Diagnosis not present

## 2021-10-23 DIAGNOSIS — S92232P Displaced fracture of intermediate cuneiform of left foot, subsequent encounter for fracture with malunion: Secondary | ICD-10-CM | POA: Diagnosis not present

## 2021-10-23 DIAGNOSIS — M1460 Charcot's joint, unspecified site: Secondary | ICD-10-CM | POA: Diagnosis not present

## 2021-10-23 DIAGNOSIS — Z853 Personal history of malignant neoplasm of breast: Secondary | ICD-10-CM | POA: Diagnosis not present

## 2021-10-26 DIAGNOSIS — M519 Unspecified thoracic, thoracolumbar and lumbosacral intervertebral disc disorder: Secondary | ICD-10-CM | POA: Diagnosis not present

## 2021-10-26 DIAGNOSIS — T8131XD Disruption of external operation (surgical) wound, not elsewhere classified, subsequent encounter: Secondary | ICD-10-CM | POA: Diagnosis not present

## 2021-10-26 DIAGNOSIS — M1460 Charcot's joint, unspecified site: Secondary | ICD-10-CM | POA: Diagnosis not present

## 2021-10-26 DIAGNOSIS — Z853 Personal history of malignant neoplasm of breast: Secondary | ICD-10-CM | POA: Diagnosis not present

## 2021-10-26 DIAGNOSIS — M19072 Primary osteoarthritis, left ankle and foot: Secondary | ICD-10-CM | POA: Diagnosis not present

## 2021-10-26 DIAGNOSIS — Z96653 Presence of artificial knee joint, bilateral: Secondary | ICD-10-CM | POA: Diagnosis not present

## 2021-10-26 DIAGNOSIS — S92232P Displaced fracture of intermediate cuneiform of left foot, subsequent encounter for fracture with malunion: Secondary | ICD-10-CM | POA: Diagnosis not present

## 2021-10-26 DIAGNOSIS — S92242P Displaced fracture of medial cuneiform of left foot, subsequent encounter for fracture with malunion: Secondary | ICD-10-CM | POA: Diagnosis not present

## 2021-10-26 DIAGNOSIS — Z9181 History of falling: Secondary | ICD-10-CM | POA: Diagnosis not present

## 2021-10-26 DIAGNOSIS — I10 Essential (primary) hypertension: Secondary | ICD-10-CM | POA: Diagnosis not present

## 2021-10-26 DIAGNOSIS — Z981 Arthrodesis status: Secondary | ICD-10-CM | POA: Diagnosis not present

## 2021-10-29 DIAGNOSIS — S92242P Displaced fracture of medial cuneiform of left foot, subsequent encounter for fracture with malunion: Secondary | ICD-10-CM | POA: Diagnosis not present

## 2021-10-29 DIAGNOSIS — M1460 Charcot's joint, unspecified site: Secondary | ICD-10-CM | POA: Diagnosis not present

## 2021-10-29 DIAGNOSIS — M19072 Primary osteoarthritis, left ankle and foot: Secondary | ICD-10-CM | POA: Diagnosis not present

## 2021-10-29 DIAGNOSIS — Z981 Arthrodesis status: Secondary | ICD-10-CM | POA: Diagnosis not present

## 2021-10-29 DIAGNOSIS — M519 Unspecified thoracic, thoracolumbar and lumbosacral intervertebral disc disorder: Secondary | ICD-10-CM | POA: Diagnosis not present

## 2021-10-29 DIAGNOSIS — I10 Essential (primary) hypertension: Secondary | ICD-10-CM | POA: Diagnosis not present

## 2021-10-29 DIAGNOSIS — T8131XD Disruption of external operation (surgical) wound, not elsewhere classified, subsequent encounter: Secondary | ICD-10-CM | POA: Diagnosis not present

## 2021-10-29 DIAGNOSIS — Z96653 Presence of artificial knee joint, bilateral: Secondary | ICD-10-CM | POA: Diagnosis not present

## 2021-10-29 DIAGNOSIS — S92232P Displaced fracture of intermediate cuneiform of left foot, subsequent encounter for fracture with malunion: Secondary | ICD-10-CM | POA: Diagnosis not present

## 2021-10-29 DIAGNOSIS — Z853 Personal history of malignant neoplasm of breast: Secondary | ICD-10-CM | POA: Diagnosis not present

## 2021-10-29 DIAGNOSIS — Z9181 History of falling: Secondary | ICD-10-CM | POA: Diagnosis not present

## 2021-11-02 DIAGNOSIS — Z96653 Presence of artificial knee joint, bilateral: Secondary | ICD-10-CM | POA: Diagnosis not present

## 2021-11-02 DIAGNOSIS — M519 Unspecified thoracic, thoracolumbar and lumbosacral intervertebral disc disorder: Secondary | ICD-10-CM | POA: Diagnosis not present

## 2021-11-02 DIAGNOSIS — M19072 Primary osteoarthritis, left ankle and foot: Secondary | ICD-10-CM | POA: Diagnosis not present

## 2021-11-02 DIAGNOSIS — Z9181 History of falling: Secondary | ICD-10-CM | POA: Diagnosis not present

## 2021-11-02 DIAGNOSIS — I10 Essential (primary) hypertension: Secondary | ICD-10-CM | POA: Diagnosis not present

## 2021-11-02 DIAGNOSIS — S92232P Displaced fracture of intermediate cuneiform of left foot, subsequent encounter for fracture with malunion: Secondary | ICD-10-CM | POA: Diagnosis not present

## 2021-11-02 DIAGNOSIS — Z853 Personal history of malignant neoplasm of breast: Secondary | ICD-10-CM | POA: Diagnosis not present

## 2021-11-02 DIAGNOSIS — S92242P Displaced fracture of medial cuneiform of left foot, subsequent encounter for fracture with malunion: Secondary | ICD-10-CM | POA: Diagnosis not present

## 2021-11-02 DIAGNOSIS — Z981 Arthrodesis status: Secondary | ICD-10-CM | POA: Diagnosis not present

## 2021-11-02 DIAGNOSIS — T8131XD Disruption of external operation (surgical) wound, not elsewhere classified, subsequent encounter: Secondary | ICD-10-CM | POA: Diagnosis not present

## 2021-11-02 DIAGNOSIS — M1460 Charcot's joint, unspecified site: Secondary | ICD-10-CM | POA: Diagnosis not present

## 2021-11-03 DIAGNOSIS — Z853 Personal history of malignant neoplasm of breast: Secondary | ICD-10-CM | POA: Diagnosis not present

## 2021-11-03 DIAGNOSIS — M19072 Primary osteoarthritis, left ankle and foot: Secondary | ICD-10-CM | POA: Diagnosis not present

## 2021-11-03 DIAGNOSIS — M519 Unspecified thoracic, thoracolumbar and lumbosacral intervertebral disc disorder: Secondary | ICD-10-CM | POA: Diagnosis not present

## 2021-11-03 DIAGNOSIS — Z9181 History of falling: Secondary | ICD-10-CM | POA: Diagnosis not present

## 2021-11-03 DIAGNOSIS — I10 Essential (primary) hypertension: Secondary | ICD-10-CM | POA: Diagnosis not present

## 2021-11-03 DIAGNOSIS — Z981 Arthrodesis status: Secondary | ICD-10-CM | POA: Diagnosis not present

## 2021-11-03 DIAGNOSIS — S92242P Displaced fracture of medial cuneiform of left foot, subsequent encounter for fracture with malunion: Secondary | ICD-10-CM | POA: Diagnosis not present

## 2021-11-03 DIAGNOSIS — M1460 Charcot's joint, unspecified site: Secondary | ICD-10-CM | POA: Diagnosis not present

## 2021-11-03 DIAGNOSIS — T8131XD Disruption of external operation (surgical) wound, not elsewhere classified, subsequent encounter: Secondary | ICD-10-CM | POA: Diagnosis not present

## 2021-11-03 DIAGNOSIS — S92232P Displaced fracture of intermediate cuneiform of left foot, subsequent encounter for fracture with malunion: Secondary | ICD-10-CM | POA: Diagnosis not present

## 2021-11-03 DIAGNOSIS — Z96653 Presence of artificial knee joint, bilateral: Secondary | ICD-10-CM | POA: Diagnosis not present

## 2021-11-04 DIAGNOSIS — T8131XD Disruption of external operation (surgical) wound, not elsewhere classified, subsequent encounter: Secondary | ICD-10-CM | POA: Diagnosis not present

## 2021-11-04 DIAGNOSIS — Z96653 Presence of artificial knee joint, bilateral: Secondary | ICD-10-CM | POA: Diagnosis not present

## 2021-11-04 DIAGNOSIS — S92242P Displaced fracture of medial cuneiform of left foot, subsequent encounter for fracture with malunion: Secondary | ICD-10-CM | POA: Diagnosis not present

## 2021-11-04 DIAGNOSIS — Z981 Arthrodesis status: Secondary | ICD-10-CM | POA: Diagnosis not present

## 2021-11-04 DIAGNOSIS — Z9181 History of falling: Secondary | ICD-10-CM | POA: Diagnosis not present

## 2021-11-04 DIAGNOSIS — M19072 Primary osteoarthritis, left ankle and foot: Secondary | ICD-10-CM | POA: Diagnosis not present

## 2021-11-04 DIAGNOSIS — S92232P Displaced fracture of intermediate cuneiform of left foot, subsequent encounter for fracture with malunion: Secondary | ICD-10-CM | POA: Diagnosis not present

## 2021-11-04 DIAGNOSIS — M519 Unspecified thoracic, thoracolumbar and lumbosacral intervertebral disc disorder: Secondary | ICD-10-CM | POA: Diagnosis not present

## 2021-11-04 DIAGNOSIS — M1460 Charcot's joint, unspecified site: Secondary | ICD-10-CM | POA: Diagnosis not present

## 2021-11-04 DIAGNOSIS — I10 Essential (primary) hypertension: Secondary | ICD-10-CM | POA: Diagnosis not present

## 2021-11-04 DIAGNOSIS — Z853 Personal history of malignant neoplasm of breast: Secondary | ICD-10-CM | POA: Diagnosis not present

## 2021-11-05 DIAGNOSIS — X58XXXD Exposure to other specified factors, subsequent encounter: Secondary | ICD-10-CM | POA: Diagnosis not present

## 2021-11-05 DIAGNOSIS — S92242P Displaced fracture of medial cuneiform of left foot, subsequent encounter for fracture with malunion: Secondary | ICD-10-CM | POA: Diagnosis not present

## 2021-11-05 DIAGNOSIS — L97521 Non-pressure chronic ulcer of other part of left foot limited to breakdown of skin: Secondary | ICD-10-CM | POA: Diagnosis not present

## 2021-11-05 DIAGNOSIS — M14672 Charcot's joint, left ankle and foot: Secondary | ICD-10-CM | POA: Diagnosis not present

## 2021-11-05 DIAGNOSIS — S92232P Displaced fracture of intermediate cuneiform of left foot, subsequent encounter for fracture with malunion: Secondary | ICD-10-CM | POA: Diagnosis not present

## 2021-11-09 DIAGNOSIS — T8131XD Disruption of external operation (surgical) wound, not elsewhere classified, subsequent encounter: Secondary | ICD-10-CM | POA: Diagnosis not present

## 2021-11-09 DIAGNOSIS — S92242P Displaced fracture of medial cuneiform of left foot, subsequent encounter for fracture with malunion: Secondary | ICD-10-CM | POA: Diagnosis not present

## 2021-11-09 DIAGNOSIS — Z981 Arthrodesis status: Secondary | ICD-10-CM | POA: Diagnosis not present

## 2021-11-09 DIAGNOSIS — S92232P Displaced fracture of intermediate cuneiform of left foot, subsequent encounter for fracture with malunion: Secondary | ICD-10-CM | POA: Diagnosis not present

## 2021-11-09 DIAGNOSIS — I10 Essential (primary) hypertension: Secondary | ICD-10-CM | POA: Diagnosis not present

## 2021-11-09 DIAGNOSIS — M519 Unspecified thoracic, thoracolumbar and lumbosacral intervertebral disc disorder: Secondary | ICD-10-CM | POA: Diagnosis not present

## 2021-11-09 DIAGNOSIS — Z9181 History of falling: Secondary | ICD-10-CM | POA: Diagnosis not present

## 2021-11-09 DIAGNOSIS — M19072 Primary osteoarthritis, left ankle and foot: Secondary | ICD-10-CM | POA: Diagnosis not present

## 2021-11-09 DIAGNOSIS — Z853 Personal history of malignant neoplasm of breast: Secondary | ICD-10-CM | POA: Diagnosis not present

## 2021-11-09 DIAGNOSIS — Z96653 Presence of artificial knee joint, bilateral: Secondary | ICD-10-CM | POA: Diagnosis not present

## 2021-11-09 DIAGNOSIS — M1460 Charcot's joint, unspecified site: Secondary | ICD-10-CM | POA: Diagnosis not present

## 2021-11-10 ENCOUNTER — Ambulatory Visit (INDEPENDENT_AMBULATORY_CARE_PROVIDER_SITE_OTHER): Payer: Medicare Other | Admitting: Podiatrist

## 2021-11-10 ENCOUNTER — Encounter: Payer: Self-pay | Admitting: Podiatrist

## 2021-11-10 DIAGNOSIS — M79609 Pain in unspecified limb: Secondary | ICD-10-CM

## 2021-11-10 DIAGNOSIS — B351 Tinea unguium: Secondary | ICD-10-CM

## 2021-11-10 NOTE — Patient Instructions (Signed)

## 2021-11-10 NOTE — Progress Notes (Signed)
Chief Complaint  Patient presents with   Nail Problem    RFC - Bilateral nail trim     HPI: Patient is 86 y.o. female who presents today for routine foot care.  She relates pain with ambulation and in shoe gear due to the long and thickened toenails.  She relates that she recently had Charcot foot surgery by Dr. Lorraine Lax at [facility].  She denies being diabetic.  She has a bandage in place on this left foot and states overall she feels like she is healing well.  Patient Active Problem List   Diagnosis Date Noted   Thyroid nodule 05/11/2017   Long-term current use of tamoxifen 03/07/2017   Malignant neoplasm of overlapping sites of left breast in female, estrogen receptor positive ([place]) 03/07/2017   S/P arthroscopy of shoulder 05/25/2016   Chronic right shoulder pain 03/04/2016   Use of tamoxifen (Nolvadex) 02/19/2016   S/P total knee replacement 11/10/2015   Acid reflux 12/08/2013   Adaptive colitis 12/08/2013   Adiposity 12/08/2013   Allergic rhinitis 12/08/2013   Arm swelling 12/08/2013   Arthritis, degenerative 12/08/2013   Avitaminosis D 12/08/2013   Blood pressure elevated without history of HTN 12/08/2013   BP (high blood pressure) 12/08/2013   Breast lump 12/08/2013   Bursitis 12/08/2013   Calcaneal spur 12/08/2013   Calcium blood increased 12/08/2013   Cephalalgia 12/08/2013   Clinical depression 12/08/2013   Closed fracture of distal end of radius 12/08/2013   Contusion of shoulder 12/08/2013   Difficulty hearing 12/08/2013   Disturbance in sleep behavior 12/08/2013   Dizziness 12/08/2013   Ecchymosis 12/08/2013   Elevated fasting blood sugar 12/08/2013   Feeling bilious 12/08/2013   Female genuine stress incontinence 12/08/2013   Goiter, nontoxic, multinodular 12/08/2013   Arthralgia of hip or thigh 12/08/2013   Headache, migraine 12/08/2013   HLD (hyperlipidemia) 12/08/2013   Intrinsic sphincter deficiency 12/08/2013   LBP (low back pain) 12/08/2013   Malignant  neoplasm of female breast ([place]) 12/08/2013   Menopausal symptom 12/08/2013   OP (osteoporosis) 12/08/2013   Pain in the wrist 12/08/2013   Thoracic and lumbosacral neuritis 12/08/2013    Current Outpatient Medications on File Prior to Visit  Medication Sig Dispense Refill   amLODipine (NORVASC) 10 MG tablet Take 10 mg by mouth daily.     Ascorbic Acid (VITAMIN C) 100 MG tablet Take 100 mg by mouth daily.     Biotin 5000 MCG TABS Take 5,000 mcg by mouth daily. Reported on 10/27/2015     Cinnamon 500 MG capsule Take 500 mg by mouth daily as needed (for supplementation).      Coconut Oil 1000 MG CAPS Take 1,000 mg by mouth daily.      Cranberry Extract 250 MG TABS Take 250 mg by mouth daily.      DHEA 25 MG CAPS Take 25 mg by mouth daily.      ibuprofen (ADVIL) 800 MG tablet Take 800 mg by mouth every 8 (eight) hours as needed.     magnesium gluconate (MAGONATE) 500 MG tablet Take 250 mg by mouth 2 (two) times daily.     Multiple Vitamins-Minerals (PRESERVISION AREDS 2 PO) Take 1 tablet by mouth 2 (two) times daily.     neomycin-polymyxin-hydrocortisone (CORTISPORIN) OTIC solution Apply 2-3 drops to the ingrown toenail site twice daily. Cover with band-aid. 10 mL 0   orphenadrine (NORFLEX) 30 MG/ML injection      rizatriptan (MAXALT) 10 MG tablet Take 10 mg by mouth  as needed. May repeat in 2 hours if needed     rOPINIRole (REQUIP) 3 MG tablet Take 3 mg by mouth at bedtime.      tamoxifen (NOLVADEX) 20 MG tablet Take 20 mg by mouth daily.      vitamin B-12 (CYANOCOBALAMIN) 1000 MCG tablet Take 2,500 mcg by mouth daily.      vitamin E 400 UNIT capsule Take 400 Units by mouth daily.     zinc gluconate 50 MG tablet Take 50 mg by mouth daily.     zolpidem (AMBIEN) 5 MG tablet Take 5 mg by mouth at bedtime.     No current facility-administered medications on file prior to visit.    Allergies  Allergen Reactions   Requip [Ropinirole Hcl] Other (See Comments)    Hallucinations at 4 mg strength    Morphine And Related Nausea And Vomiting    Review of Systems No fevers, chills, nausea, muscle aches, no difficulty breathing, no calf pain, no chest pain or shortness of breath.   Physical Exam  GENERAL APPEARANCE: Alert, conversant. Appropriately groomed. No acute distress.   VASCULAR: Pedal pulses palpable 2/4 DP and PT bilateral.  Capillary refill time is immediate to all digits,  Proximal to distal cooling it warm to warm.  Digital perfusion adequate.  Mild left foot swelling is noted status post surgery.  NEUROLOGIC: sensation is intact to 5.07 monofilament at 5/5 sites bilateral.  Light touch is intact bilateral, vibratory sensation intact bilateral  MUSCULOSKELETAL: acceptable muscle strength, tone and stability bilateral.  No gross boney pedal deformities noted.  No pain, crepitus or limitation noted with foot and ankle range of motion bilateral.   DERMATOLOGIC: skin is warm, supple, and dry.  Patient's toenails are thickened, discolored, dystrophic, friable brittle and clinically mycotic x10.  Bandages left in place on the left foot/ankle from her recent Charcot joint surgery.  Assessment     ICD-10-CM   1. Pain due to onychomycosis of nail  B35.1    M79.609        Plan  Today's visit I debrided the patient's nails with a sterile nail nipper and power bur without complication.  Patient will be seen back in 3 months or as needed for follow-up if any problems or concerns arise she will let us know.

## 2021-11-11 DIAGNOSIS — M19072 Primary osteoarthritis, left ankle and foot: Secondary | ICD-10-CM | POA: Diagnosis not present

## 2021-11-11 DIAGNOSIS — M1460 Charcot's joint, unspecified site: Secondary | ICD-10-CM | POA: Diagnosis not present

## 2021-11-11 DIAGNOSIS — S92232P Displaced fracture of intermediate cuneiform of left foot, subsequent encounter for fracture with malunion: Secondary | ICD-10-CM | POA: Diagnosis not present

## 2021-11-11 DIAGNOSIS — I10 Essential (primary) hypertension: Secondary | ICD-10-CM | POA: Diagnosis not present

## 2021-11-11 DIAGNOSIS — T8131XD Disruption of external operation (surgical) wound, not elsewhere classified, subsequent encounter: Secondary | ICD-10-CM | POA: Diagnosis not present

## 2021-11-11 DIAGNOSIS — M519 Unspecified thoracic, thoracolumbar and lumbosacral intervertebral disc disorder: Secondary | ICD-10-CM | POA: Diagnosis not present

## 2021-11-11 DIAGNOSIS — Z96653 Presence of artificial knee joint, bilateral: Secondary | ICD-10-CM | POA: Diagnosis not present

## 2021-11-11 DIAGNOSIS — Z853 Personal history of malignant neoplasm of breast: Secondary | ICD-10-CM | POA: Diagnosis not present

## 2021-11-11 DIAGNOSIS — S92242P Displaced fracture of medial cuneiform of left foot, subsequent encounter for fracture with malunion: Secondary | ICD-10-CM | POA: Diagnosis not present

## 2021-11-11 DIAGNOSIS — Z9181 History of falling: Secondary | ICD-10-CM | POA: Diagnosis not present

## 2021-11-11 DIAGNOSIS — Z981 Arthrodesis status: Secondary | ICD-10-CM | POA: Diagnosis not present

## 2021-11-12 DIAGNOSIS — M519 Unspecified thoracic, thoracolumbar and lumbosacral intervertebral disc disorder: Secondary | ICD-10-CM | POA: Diagnosis not present

## 2021-11-12 DIAGNOSIS — Z853 Personal history of malignant neoplasm of breast: Secondary | ICD-10-CM | POA: Diagnosis not present

## 2021-11-12 DIAGNOSIS — M19072 Primary osteoarthritis, left ankle and foot: Secondary | ICD-10-CM | POA: Diagnosis not present

## 2021-11-12 DIAGNOSIS — S92232P Displaced fracture of intermediate cuneiform of left foot, subsequent encounter for fracture with malunion: Secondary | ICD-10-CM | POA: Diagnosis not present

## 2021-11-12 DIAGNOSIS — Z96653 Presence of artificial knee joint, bilateral: Secondary | ICD-10-CM | POA: Diagnosis not present

## 2021-11-12 DIAGNOSIS — S92242P Displaced fracture of medial cuneiform of left foot, subsequent encounter for fracture with malunion: Secondary | ICD-10-CM | POA: Diagnosis not present

## 2021-11-12 DIAGNOSIS — Z9181 History of falling: Secondary | ICD-10-CM | POA: Diagnosis not present

## 2021-11-12 DIAGNOSIS — I10 Essential (primary) hypertension: Secondary | ICD-10-CM | POA: Diagnosis not present

## 2021-11-12 DIAGNOSIS — T8131XD Disruption of external operation (surgical) wound, not elsewhere classified, subsequent encounter: Secondary | ICD-10-CM | POA: Diagnosis not present

## 2021-11-12 DIAGNOSIS — M1460 Charcot's joint, unspecified site: Secondary | ICD-10-CM | POA: Diagnosis not present

## 2021-11-12 DIAGNOSIS — Z981 Arthrodesis status: Secondary | ICD-10-CM | POA: Diagnosis not present

## 2021-11-16 DIAGNOSIS — M519 Unspecified thoracic, thoracolumbar and lumbosacral intervertebral disc disorder: Secondary | ICD-10-CM | POA: Diagnosis not present

## 2021-11-16 DIAGNOSIS — I10 Essential (primary) hypertension: Secondary | ICD-10-CM | POA: Diagnosis not present

## 2021-11-16 DIAGNOSIS — T8131XD Disruption of external operation (surgical) wound, not elsewhere classified, subsequent encounter: Secondary | ICD-10-CM | POA: Diagnosis not present

## 2021-11-16 DIAGNOSIS — M19072 Primary osteoarthritis, left ankle and foot: Secondary | ICD-10-CM | POA: Diagnosis not present

## 2021-11-16 DIAGNOSIS — Z981 Arthrodesis status: Secondary | ICD-10-CM | POA: Diagnosis not present

## 2021-11-16 DIAGNOSIS — Z9181 History of falling: Secondary | ICD-10-CM | POA: Diagnosis not present

## 2021-11-16 DIAGNOSIS — Z853 Personal history of malignant neoplasm of breast: Secondary | ICD-10-CM | POA: Diagnosis not present

## 2021-11-16 DIAGNOSIS — Z96653 Presence of artificial knee joint, bilateral: Secondary | ICD-10-CM | POA: Diagnosis not present

## 2021-11-16 DIAGNOSIS — M1460 Charcot's joint, unspecified site: Secondary | ICD-10-CM | POA: Diagnosis not present

## 2021-11-16 DIAGNOSIS — S92232P Displaced fracture of intermediate cuneiform of left foot, subsequent encounter for fracture with malunion: Secondary | ICD-10-CM | POA: Diagnosis not present

## 2021-11-16 DIAGNOSIS — S92242P Displaced fracture of medial cuneiform of left foot, subsequent encounter for fracture with malunion: Secondary | ICD-10-CM | POA: Diagnosis not present

## 2021-11-18 DIAGNOSIS — M6281 Muscle weakness (generalized): Secondary | ICD-10-CM | POA: Diagnosis not present

## 2021-11-18 DIAGNOSIS — M1991 Primary osteoarthritis, unspecified site: Secondary | ICD-10-CM | POA: Diagnosis not present

## 2021-11-18 DIAGNOSIS — R2689 Other abnormalities of gait and mobility: Secondary | ICD-10-CM | POA: Diagnosis not present

## 2021-11-18 DIAGNOSIS — M19079 Primary osteoarthritis, unspecified ankle and foot: Secondary | ICD-10-CM | POA: Diagnosis not present

## 2021-11-19 DIAGNOSIS — Z9181 History of falling: Secondary | ICD-10-CM | POA: Diagnosis not present

## 2021-11-19 DIAGNOSIS — Z853 Personal history of malignant neoplasm of breast: Secondary | ICD-10-CM | POA: Diagnosis not present

## 2021-11-19 DIAGNOSIS — M519 Unspecified thoracic, thoracolumbar and lumbosacral intervertebral disc disorder: Secondary | ICD-10-CM | POA: Diagnosis not present

## 2021-11-19 DIAGNOSIS — Z96653 Presence of artificial knee joint, bilateral: Secondary | ICD-10-CM | POA: Diagnosis not present

## 2021-11-19 DIAGNOSIS — Z981 Arthrodesis status: Secondary | ICD-10-CM | POA: Diagnosis not present

## 2021-11-19 DIAGNOSIS — S92242P Displaced fracture of medial cuneiform of left foot, subsequent encounter for fracture with malunion: Secondary | ICD-10-CM | POA: Diagnosis not present

## 2021-11-19 DIAGNOSIS — S92232P Displaced fracture of intermediate cuneiform of left foot, subsequent encounter for fracture with malunion: Secondary | ICD-10-CM | POA: Diagnosis not present

## 2021-11-19 DIAGNOSIS — M19072 Primary osteoarthritis, left ankle and foot: Secondary | ICD-10-CM | POA: Diagnosis not present

## 2021-11-19 DIAGNOSIS — M1460 Charcot's joint, unspecified site: Secondary | ICD-10-CM | POA: Diagnosis not present

## 2021-11-19 DIAGNOSIS — I10 Essential (primary) hypertension: Secondary | ICD-10-CM | POA: Diagnosis not present

## 2021-11-19 DIAGNOSIS — T8131XD Disruption of external operation (surgical) wound, not elsewhere classified, subsequent encounter: Secondary | ICD-10-CM | POA: Diagnosis not present

## 2021-11-23 DIAGNOSIS — M519 Unspecified thoracic, thoracolumbar and lumbosacral intervertebral disc disorder: Secondary | ICD-10-CM | POA: Diagnosis not present

## 2021-11-23 DIAGNOSIS — Z981 Arthrodesis status: Secondary | ICD-10-CM | POA: Diagnosis not present

## 2021-11-23 DIAGNOSIS — S92242P Displaced fracture of medial cuneiform of left foot, subsequent encounter for fracture with malunion: Secondary | ICD-10-CM | POA: Diagnosis not present

## 2021-11-23 DIAGNOSIS — S92232P Displaced fracture of intermediate cuneiform of left foot, subsequent encounter for fracture with malunion: Secondary | ICD-10-CM | POA: Diagnosis not present

## 2021-11-23 DIAGNOSIS — T8131XD Disruption of external operation (surgical) wound, not elsewhere classified, subsequent encounter: Secondary | ICD-10-CM | POA: Diagnosis not present

## 2021-11-23 DIAGNOSIS — Z9181 History of falling: Secondary | ICD-10-CM | POA: Diagnosis not present

## 2021-11-23 DIAGNOSIS — I10 Essential (primary) hypertension: Secondary | ICD-10-CM | POA: Diagnosis not present

## 2021-11-23 DIAGNOSIS — Z96653 Presence of artificial knee joint, bilateral: Secondary | ICD-10-CM | POA: Diagnosis not present

## 2021-11-23 DIAGNOSIS — M19072 Primary osteoarthritis, left ankle and foot: Secondary | ICD-10-CM | POA: Diagnosis not present

## 2021-11-23 DIAGNOSIS — Z853 Personal history of malignant neoplasm of breast: Secondary | ICD-10-CM | POA: Diagnosis not present

## 2021-11-23 DIAGNOSIS — M1460 Charcot's joint, unspecified site: Secondary | ICD-10-CM | POA: Diagnosis not present

## 2021-11-24 DIAGNOSIS — T8131XD Disruption of external operation (surgical) wound, not elsewhere classified, subsequent encounter: Secondary | ICD-10-CM | POA: Diagnosis not present

## 2021-11-24 DIAGNOSIS — M1460 Charcot's joint, unspecified site: Secondary | ICD-10-CM | POA: Diagnosis not present

## 2021-11-24 DIAGNOSIS — M519 Unspecified thoracic, thoracolumbar and lumbosacral intervertebral disc disorder: Secondary | ICD-10-CM | POA: Diagnosis not present

## 2021-11-24 DIAGNOSIS — Z853 Personal history of malignant neoplasm of breast: Secondary | ICD-10-CM | POA: Diagnosis not present

## 2021-11-24 DIAGNOSIS — M19072 Primary osteoarthritis, left ankle and foot: Secondary | ICD-10-CM | POA: Diagnosis not present

## 2021-11-24 DIAGNOSIS — I10 Essential (primary) hypertension: Secondary | ICD-10-CM | POA: Diagnosis not present

## 2021-11-24 DIAGNOSIS — S92242P Displaced fracture of medial cuneiform of left foot, subsequent encounter for fracture with malunion: Secondary | ICD-10-CM | POA: Diagnosis not present

## 2021-11-24 DIAGNOSIS — Z981 Arthrodesis status: Secondary | ICD-10-CM | POA: Diagnosis not present

## 2021-11-24 DIAGNOSIS — S92232P Displaced fracture of intermediate cuneiform of left foot, subsequent encounter for fracture with malunion: Secondary | ICD-10-CM | POA: Diagnosis not present

## 2021-11-24 DIAGNOSIS — Z9181 History of falling: Secondary | ICD-10-CM | POA: Diagnosis not present

## 2021-11-24 DIAGNOSIS — Z96653 Presence of artificial knee joint, bilateral: Secondary | ICD-10-CM | POA: Diagnosis not present

## 2021-11-26 DIAGNOSIS — M12811 Other specific arthropathies, not elsewhere classified, right shoulder: Secondary | ICD-10-CM | POA: Diagnosis not present

## 2021-11-30 DIAGNOSIS — T8131XD Disruption of external operation (surgical) wound, not elsewhere classified, subsequent encounter: Secondary | ICD-10-CM | POA: Diagnosis not present

## 2021-11-30 DIAGNOSIS — S92232P Displaced fracture of intermediate cuneiform of left foot, subsequent encounter for fracture with malunion: Secondary | ICD-10-CM | POA: Diagnosis not present

## 2021-11-30 DIAGNOSIS — M1460 Charcot's joint, unspecified site: Secondary | ICD-10-CM | POA: Diagnosis not present

## 2021-11-30 DIAGNOSIS — M519 Unspecified thoracic, thoracolumbar and lumbosacral intervertebral disc disorder: Secondary | ICD-10-CM | POA: Diagnosis not present

## 2021-11-30 DIAGNOSIS — Z981 Arthrodesis status: Secondary | ICD-10-CM | POA: Diagnosis not present

## 2021-11-30 DIAGNOSIS — M19072 Primary osteoarthritis, left ankle and foot: Secondary | ICD-10-CM | POA: Diagnosis not present

## 2021-11-30 DIAGNOSIS — Z9181 History of falling: Secondary | ICD-10-CM | POA: Diagnosis not present

## 2021-11-30 DIAGNOSIS — S92242P Displaced fracture of medial cuneiform of left foot, subsequent encounter for fracture with malunion: Secondary | ICD-10-CM | POA: Diagnosis not present

## 2021-11-30 DIAGNOSIS — I10 Essential (primary) hypertension: Secondary | ICD-10-CM | POA: Diagnosis not present

## 2021-11-30 DIAGNOSIS — Z96653 Presence of artificial knee joint, bilateral: Secondary | ICD-10-CM | POA: Diagnosis not present

## 2021-11-30 DIAGNOSIS — Z853 Personal history of malignant neoplasm of breast: Secondary | ICD-10-CM | POA: Diagnosis not present

## 2021-12-01 DIAGNOSIS — S92242P Displaced fracture of medial cuneiform of left foot, subsequent encounter for fracture with malunion: Secondary | ICD-10-CM | POA: Diagnosis not present

## 2021-12-01 DIAGNOSIS — M519 Unspecified thoracic, thoracolumbar and lumbosacral intervertebral disc disorder: Secondary | ICD-10-CM | POA: Diagnosis not present

## 2021-12-01 DIAGNOSIS — M19072 Primary osteoarthritis, left ankle and foot: Secondary | ICD-10-CM | POA: Diagnosis not present

## 2021-12-01 DIAGNOSIS — Z96653 Presence of artificial knee joint, bilateral: Secondary | ICD-10-CM | POA: Diagnosis not present

## 2021-12-01 DIAGNOSIS — Z9181 History of falling: Secondary | ICD-10-CM | POA: Diagnosis not present

## 2021-12-01 DIAGNOSIS — T8131XD Disruption of external operation (surgical) wound, not elsewhere classified, subsequent encounter: Secondary | ICD-10-CM | POA: Diagnosis not present

## 2021-12-01 DIAGNOSIS — S92232P Displaced fracture of intermediate cuneiform of left foot, subsequent encounter for fracture with malunion: Secondary | ICD-10-CM | POA: Diagnosis not present

## 2021-12-01 DIAGNOSIS — Z981 Arthrodesis status: Secondary | ICD-10-CM | POA: Diagnosis not present

## 2021-12-01 DIAGNOSIS — I10 Essential (primary) hypertension: Secondary | ICD-10-CM | POA: Diagnosis not present

## 2021-12-01 DIAGNOSIS — M1460 Charcot's joint, unspecified site: Secondary | ICD-10-CM | POA: Diagnosis not present

## 2021-12-01 DIAGNOSIS — Z853 Personal history of malignant neoplasm of breast: Secondary | ICD-10-CM | POA: Diagnosis not present

## 2021-12-07 DIAGNOSIS — S92232P Displaced fracture of intermediate cuneiform of left foot, subsequent encounter for fracture with malunion: Secondary | ICD-10-CM | POA: Diagnosis not present

## 2021-12-07 DIAGNOSIS — M1460 Charcot's joint, unspecified site: Secondary | ICD-10-CM | POA: Diagnosis not present

## 2021-12-07 DIAGNOSIS — X58XXXD Exposure to other specified factors, subsequent encounter: Secondary | ICD-10-CM | POA: Diagnosis not present

## 2021-12-07 DIAGNOSIS — L97521 Non-pressure chronic ulcer of other part of left foot limited to breakdown of skin: Secondary | ICD-10-CM | POA: Diagnosis not present

## 2021-12-08 DIAGNOSIS — Z923 Personal history of irradiation: Secondary | ICD-10-CM | POA: Diagnosis not present

## 2021-12-08 DIAGNOSIS — D649 Anemia, unspecified: Secondary | ICD-10-CM | POA: Diagnosis not present

## 2021-12-08 DIAGNOSIS — Z170 Estrogen receptor positive status [ER+]: Secondary | ICD-10-CM | POA: Diagnosis not present

## 2021-12-08 DIAGNOSIS — C50812 Malignant neoplasm of overlapping sites of left female breast: Secondary | ICD-10-CM | POA: Diagnosis not present

## 2021-12-09 DIAGNOSIS — I10 Essential (primary) hypertension: Secondary | ICD-10-CM | POA: Diagnosis not present

## 2021-12-09 DIAGNOSIS — Z9181 History of falling: Secondary | ICD-10-CM | POA: Diagnosis not present

## 2021-12-09 DIAGNOSIS — S92242P Displaced fracture of medial cuneiform of left foot, subsequent encounter for fracture with malunion: Secondary | ICD-10-CM | POA: Diagnosis not present

## 2021-12-09 DIAGNOSIS — M1460 Charcot's joint, unspecified site: Secondary | ICD-10-CM | POA: Diagnosis not present

## 2021-12-09 DIAGNOSIS — T8131XD Disruption of external operation (surgical) wound, not elsewhere classified, subsequent encounter: Secondary | ICD-10-CM | POA: Diagnosis not present

## 2021-12-09 DIAGNOSIS — M19072 Primary osteoarthritis, left ankle and foot: Secondary | ICD-10-CM | POA: Diagnosis not present

## 2021-12-09 DIAGNOSIS — Z96653 Presence of artificial knee joint, bilateral: Secondary | ICD-10-CM | POA: Diagnosis not present

## 2021-12-09 DIAGNOSIS — Z981 Arthrodesis status: Secondary | ICD-10-CM | POA: Diagnosis not present

## 2021-12-09 DIAGNOSIS — Z853 Personal history of malignant neoplasm of breast: Secondary | ICD-10-CM | POA: Diagnosis not present

## 2021-12-09 DIAGNOSIS — S92232P Displaced fracture of intermediate cuneiform of left foot, subsequent encounter for fracture with malunion: Secondary | ICD-10-CM | POA: Diagnosis not present

## 2021-12-09 DIAGNOSIS — M519 Unspecified thoracic, thoracolumbar and lumbosacral intervertebral disc disorder: Secondary | ICD-10-CM | POA: Diagnosis not present

## 2021-12-15 DIAGNOSIS — Z853 Personal history of malignant neoplasm of breast: Secondary | ICD-10-CM | POA: Diagnosis not present

## 2021-12-15 DIAGNOSIS — M519 Unspecified thoracic, thoracolumbar and lumbosacral intervertebral disc disorder: Secondary | ICD-10-CM | POA: Diagnosis not present

## 2021-12-15 DIAGNOSIS — S92232P Displaced fracture of intermediate cuneiform of left foot, subsequent encounter for fracture with malunion: Secondary | ICD-10-CM | POA: Diagnosis not present

## 2021-12-15 DIAGNOSIS — I10 Essential (primary) hypertension: Secondary | ICD-10-CM | POA: Diagnosis not present

## 2021-12-15 DIAGNOSIS — M1460 Charcot's joint, unspecified site: Secondary | ICD-10-CM | POA: Diagnosis not present

## 2021-12-15 DIAGNOSIS — T8131XD Disruption of external operation (surgical) wound, not elsewhere classified, subsequent encounter: Secondary | ICD-10-CM | POA: Diagnosis not present

## 2021-12-15 DIAGNOSIS — Z9181 History of falling: Secondary | ICD-10-CM | POA: Diagnosis not present

## 2021-12-15 DIAGNOSIS — S92242P Displaced fracture of medial cuneiform of left foot, subsequent encounter for fracture with malunion: Secondary | ICD-10-CM | POA: Diagnosis not present

## 2021-12-15 DIAGNOSIS — Z981 Arthrodesis status: Secondary | ICD-10-CM | POA: Diagnosis not present

## 2021-12-15 DIAGNOSIS — M19072 Primary osteoarthritis, left ankle and foot: Secondary | ICD-10-CM | POA: Diagnosis not present

## 2021-12-15 DIAGNOSIS — Z96653 Presence of artificial knee joint, bilateral: Secondary | ICD-10-CM | POA: Diagnosis not present

## 2021-12-18 DIAGNOSIS — R2689 Other abnormalities of gait and mobility: Secondary | ICD-10-CM | POA: Diagnosis not present

## 2021-12-18 DIAGNOSIS — M6281 Muscle weakness (generalized): Secondary | ICD-10-CM | POA: Diagnosis not present

## 2021-12-18 DIAGNOSIS — M19079 Primary osteoarthritis, unspecified ankle and foot: Secondary | ICD-10-CM | POA: Diagnosis not present

## 2021-12-18 DIAGNOSIS — M1991 Primary osteoarthritis, unspecified site: Secondary | ICD-10-CM | POA: Diagnosis not present

## 2022-01-02 DIAGNOSIS — M19079 Primary osteoarthritis, unspecified ankle and foot: Secondary | ICD-10-CM | POA: Diagnosis not present

## 2022-01-02 DIAGNOSIS — M6281 Muscle weakness (generalized): Secondary | ICD-10-CM | POA: Diagnosis not present

## 2022-01-02 DIAGNOSIS — R2689 Other abnormalities of gait and mobility: Secondary | ICD-10-CM | POA: Diagnosis not present

## 2022-01-02 DIAGNOSIS — M1991 Primary osteoarthritis, unspecified site: Secondary | ICD-10-CM | POA: Diagnosis not present

## 2022-01-14 DIAGNOSIS — M25572 Pain in left ankle and joints of left foot: Secondary | ICD-10-CM | POA: Diagnosis not present

## 2022-01-14 DIAGNOSIS — M79672 Pain in left foot: Secondary | ICD-10-CM | POA: Diagnosis not present

## 2022-01-20 DIAGNOSIS — M1991 Primary osteoarthritis, unspecified site: Secondary | ICD-10-CM | POA: Diagnosis not present

## 2022-01-20 DIAGNOSIS — M19079 Primary osteoarthritis, unspecified ankle and foot: Secondary | ICD-10-CM | POA: Diagnosis not present

## 2022-01-20 DIAGNOSIS — R2689 Other abnormalities of gait and mobility: Secondary | ICD-10-CM | POA: Diagnosis not present

## 2022-01-20 DIAGNOSIS — M6281 Muscle weakness (generalized): Secondary | ICD-10-CM | POA: Diagnosis not present

## 2022-02-04 DIAGNOSIS — R2689 Other abnormalities of gait and mobility: Secondary | ICD-10-CM | POA: Diagnosis not present

## 2022-02-04 DIAGNOSIS — M25672 Stiffness of left ankle, not elsewhere classified: Secondary | ICD-10-CM | POA: Diagnosis not present

## 2022-02-04 DIAGNOSIS — M6281 Muscle weakness (generalized): Secondary | ICD-10-CM | POA: Diagnosis not present

## 2022-02-04 DIAGNOSIS — M25572 Pain in left ankle and joints of left foot: Secondary | ICD-10-CM | POA: Diagnosis not present

## 2022-02-11 DIAGNOSIS — M25572 Pain in left ankle and joints of left foot: Secondary | ICD-10-CM | POA: Diagnosis not present

## 2022-02-11 DIAGNOSIS — M12811 Other specific arthropathies, not elsewhere classified, right shoulder: Secondary | ICD-10-CM | POA: Diagnosis not present

## 2022-02-11 DIAGNOSIS — R2689 Other abnormalities of gait and mobility: Secondary | ICD-10-CM | POA: Diagnosis not present

## 2022-02-11 DIAGNOSIS — M25672 Stiffness of left ankle, not elsewhere classified: Secondary | ICD-10-CM | POA: Diagnosis not present

## 2022-02-11 DIAGNOSIS — M6281 Muscle weakness (generalized): Secondary | ICD-10-CM | POA: Diagnosis not present

## 2022-02-17 DIAGNOSIS — M1991 Primary osteoarthritis, unspecified site: Secondary | ICD-10-CM | POA: Diagnosis not present

## 2022-02-17 DIAGNOSIS — R2689 Other abnormalities of gait and mobility: Secondary | ICD-10-CM | POA: Diagnosis not present

## 2022-02-17 DIAGNOSIS — M19079 Primary osteoarthritis, unspecified ankle and foot: Secondary | ICD-10-CM | POA: Diagnosis not present

## 2022-02-17 DIAGNOSIS — M6281 Muscle weakness (generalized): Secondary | ICD-10-CM | POA: Diagnosis not present

## 2022-02-18 DIAGNOSIS — M25572 Pain in left ankle and joints of left foot: Secondary | ICD-10-CM | POA: Diagnosis not present

## 2022-02-18 DIAGNOSIS — M6281 Muscle weakness (generalized): Secondary | ICD-10-CM | POA: Diagnosis not present

## 2022-02-18 DIAGNOSIS — R2689 Other abnormalities of gait and mobility: Secondary | ICD-10-CM | POA: Diagnosis not present

## 2022-02-18 DIAGNOSIS — M25672 Stiffness of left ankle, not elsewhere classified: Secondary | ICD-10-CM | POA: Diagnosis not present

## 2022-02-22 DIAGNOSIS — M6281 Muscle weakness (generalized): Secondary | ICD-10-CM | POA: Diagnosis not present

## 2022-02-22 DIAGNOSIS — R2689 Other abnormalities of gait and mobility: Secondary | ICD-10-CM | POA: Diagnosis not present

## 2022-02-22 DIAGNOSIS — M25572 Pain in left ankle and joints of left foot: Secondary | ICD-10-CM | POA: Diagnosis not present

## 2022-02-22 DIAGNOSIS — M25672 Stiffness of left ankle, not elsewhere classified: Secondary | ICD-10-CM | POA: Diagnosis not present

## 2022-02-25 DIAGNOSIS — M25511 Pain in right shoulder: Secondary | ICD-10-CM | POA: Diagnosis not present

## 2022-02-25 DIAGNOSIS — M25512 Pain in left shoulder: Secondary | ICD-10-CM | POA: Diagnosis not present

## 2022-03-01 DIAGNOSIS — M6281 Muscle weakness (generalized): Secondary | ICD-10-CM | POA: Diagnosis not present

## 2022-03-01 DIAGNOSIS — R2689 Other abnormalities of gait and mobility: Secondary | ICD-10-CM | POA: Diagnosis not present

## 2022-03-01 DIAGNOSIS — M25672 Stiffness of left ankle, not elsewhere classified: Secondary | ICD-10-CM | POA: Diagnosis not present

## 2022-03-01 DIAGNOSIS — M25572 Pain in left ankle and joints of left foot: Secondary | ICD-10-CM | POA: Diagnosis not present

## 2022-03-05 DIAGNOSIS — M6281 Muscle weakness (generalized): Secondary | ICD-10-CM | POA: Diagnosis not present

## 2022-03-05 DIAGNOSIS — M25672 Stiffness of left ankle, not elsewhere classified: Secondary | ICD-10-CM | POA: Diagnosis not present

## 2022-03-05 DIAGNOSIS — M25572 Pain in left ankle and joints of left foot: Secondary | ICD-10-CM | POA: Diagnosis not present

## 2022-03-05 DIAGNOSIS — R2689 Other abnormalities of gait and mobility: Secondary | ICD-10-CM | POA: Diagnosis not present

## 2022-03-08 DIAGNOSIS — M25672 Stiffness of left ankle, not elsewhere classified: Secondary | ICD-10-CM | POA: Diagnosis not present

## 2022-03-08 DIAGNOSIS — M25572 Pain in left ankle and joints of left foot: Secondary | ICD-10-CM | POA: Diagnosis not present

## 2022-03-08 DIAGNOSIS — R2689 Other abnormalities of gait and mobility: Secondary | ICD-10-CM | POA: Diagnosis not present

## 2022-03-08 DIAGNOSIS — M6281 Muscle weakness (generalized): Secondary | ICD-10-CM | POA: Diagnosis not present

## 2022-03-10 DIAGNOSIS — M6281 Muscle weakness (generalized): Secondary | ICD-10-CM | POA: Diagnosis not present

## 2022-03-10 DIAGNOSIS — M25572 Pain in left ankle and joints of left foot: Secondary | ICD-10-CM | POA: Diagnosis not present

## 2022-03-10 DIAGNOSIS — R2689 Other abnormalities of gait and mobility: Secondary | ICD-10-CM | POA: Diagnosis not present

## 2022-03-10 DIAGNOSIS — M25672 Stiffness of left ankle, not elsewhere classified: Secondary | ICD-10-CM | POA: Diagnosis not present

## 2022-03-11 DIAGNOSIS — S92242P Displaced fracture of medial cuneiform of left foot, subsequent encounter for fracture with malunion: Secondary | ICD-10-CM | POA: Diagnosis not present

## 2022-03-11 DIAGNOSIS — T84213A Breakdown (mechanical) of internal fixation device of bones of foot and toes, initial encounter: Secondary | ICD-10-CM | POA: Diagnosis not present

## 2022-03-11 DIAGNOSIS — M1460 Charcot's joint, unspecified site: Secondary | ICD-10-CM | POA: Diagnosis not present

## 2022-03-11 DIAGNOSIS — X58XXXD Exposure to other specified factors, subsequent encounter: Secondary | ICD-10-CM | POA: Diagnosis not present

## 2022-03-11 DIAGNOSIS — S92232P Displaced fracture of intermediate cuneiform of left foot, subsequent encounter for fracture with malunion: Secondary | ICD-10-CM | POA: Diagnosis not present

## 2022-03-15 DIAGNOSIS — M25672 Stiffness of left ankle, not elsewhere classified: Secondary | ICD-10-CM | POA: Diagnosis not present

## 2022-03-15 DIAGNOSIS — R2689 Other abnormalities of gait and mobility: Secondary | ICD-10-CM | POA: Diagnosis not present

## 2022-03-15 DIAGNOSIS — M6281 Muscle weakness (generalized): Secondary | ICD-10-CM | POA: Diagnosis not present

## 2022-03-15 DIAGNOSIS — M25572 Pain in left ankle and joints of left foot: Secondary | ICD-10-CM | POA: Diagnosis not present

## 2022-03-22 DIAGNOSIS — M25672 Stiffness of left ankle, not elsewhere classified: Secondary | ICD-10-CM | POA: Diagnosis not present

## 2022-03-22 DIAGNOSIS — R2689 Other abnormalities of gait and mobility: Secondary | ICD-10-CM | POA: Diagnosis not present

## 2022-03-22 DIAGNOSIS — M25572 Pain in left ankle and joints of left foot: Secondary | ICD-10-CM | POA: Diagnosis not present

## 2022-03-22 DIAGNOSIS — M6281 Muscle weakness (generalized): Secondary | ICD-10-CM | POA: Diagnosis not present

## 2022-03-23 ENCOUNTER — Ambulatory Visit: Payer: Medicare Other | Admitting: Podiatry

## 2022-03-24 DIAGNOSIS — M6281 Muscle weakness (generalized): Secondary | ICD-10-CM | POA: Diagnosis not present

## 2022-03-24 DIAGNOSIS — M25672 Stiffness of left ankle, not elsewhere classified: Secondary | ICD-10-CM | POA: Diagnosis not present

## 2022-03-24 DIAGNOSIS — R2689 Other abnormalities of gait and mobility: Secondary | ICD-10-CM | POA: Diagnosis not present

## 2022-03-24 DIAGNOSIS — M25572 Pain in left ankle and joints of left foot: Secondary | ICD-10-CM | POA: Diagnosis not present

## 2022-03-25 DIAGNOSIS — Z1231 Encounter for screening mammogram for malignant neoplasm of breast: Secondary | ICD-10-CM | POA: Diagnosis not present

## 2022-03-25 DIAGNOSIS — N6489 Other specified disorders of breast: Secondary | ICD-10-CM | POA: Diagnosis not present

## 2022-03-29 DIAGNOSIS — R2689 Other abnormalities of gait and mobility: Secondary | ICD-10-CM | POA: Diagnosis not present

## 2022-03-29 DIAGNOSIS — M25672 Stiffness of left ankle, not elsewhere classified: Secondary | ICD-10-CM | POA: Diagnosis not present

## 2022-03-29 DIAGNOSIS — M25572 Pain in left ankle and joints of left foot: Secondary | ICD-10-CM | POA: Diagnosis not present

## 2022-03-29 DIAGNOSIS — M6281 Muscle weakness (generalized): Secondary | ICD-10-CM | POA: Diagnosis not present

## 2022-03-30 ENCOUNTER — Ambulatory Visit (INDEPENDENT_AMBULATORY_CARE_PROVIDER_SITE_OTHER): Payer: Medicare Other | Admitting: Podiatry

## 2022-03-30 DIAGNOSIS — B351 Tinea unguium: Secondary | ICD-10-CM

## 2022-03-30 DIAGNOSIS — M79609 Pain in unspecified limb: Secondary | ICD-10-CM | POA: Diagnosis not present

## 2022-03-30 NOTE — Progress Notes (Signed)
  Subjective:  Patient ID: Whitney Campbell, female    DOB: [birth year],  MRN: [id number]  Chief Complaint  Patient presents with   Nail Problem    Routine Foot Care     86 y.o. female presents with the above complaint. History confirmed with patient. Patient presenting with pain related to dystrophic thickened elongated nails. Patient is unable to trim own nails related to nail dystrophy and/or mobility issues. Patient does not have a history of T2DM.   Objective:  Physical Exam: warm, good capillary refill nail exam onychomycosis of the toenails, onycholysis, and dystrophic nails DP pulses palpable, PT pulses palpable, and protective sensation intact Left Foot:  Pain with palpation of nails due to elongation and dystrophic growth.  Right Foot: Pain with palpation of nails due to elongation and dystrophic growth.   Assessment:   1. Pain due to onychomycosis of nail      Plan:  Patient was evaluated and treated and all questions answered.  #Onychomycosis with pain  -Nails palliatively debrided as below. -Educated on self-care  Procedure: Nail Debridement Rationale: Pain Type of Debridement: manual, sharp debridement. Instrumentation: Nail nipper, rotary burr. Number of Nails: 10  Return in about 3 months (around 06/29/2022) for RFC.         Everitt Amber, DPM [facility]

## 2022-03-31 DIAGNOSIS — R92321 Mammographic fibroglandular density, right breast: Secondary | ICD-10-CM | POA: Diagnosis not present

## 2022-04-05 DIAGNOSIS — M25572 Pain in left ankle and joints of left foot: Secondary | ICD-10-CM | POA: Diagnosis not present

## 2022-04-05 DIAGNOSIS — R2689 Other abnormalities of gait and mobility: Secondary | ICD-10-CM | POA: Diagnosis not present

## 2022-04-05 DIAGNOSIS — M6281 Muscle weakness (generalized): Secondary | ICD-10-CM | POA: Diagnosis not present

## 2022-04-05 DIAGNOSIS — M25672 Stiffness of left ankle, not elsewhere classified: Secondary | ICD-10-CM | POA: Diagnosis not present

## 2022-04-08 ENCOUNTER — Encounter (HOSPITAL_BASED_OUTPATIENT_CLINIC_OR_DEPARTMENT_OTHER): Payer: Self-pay | Admitting: Orthopaedic Surgery

## 2022-04-08 DIAGNOSIS — M25511 Pain in right shoulder: Secondary | ICD-10-CM | POA: Diagnosis not present

## 2022-04-08 DIAGNOSIS — M25611 Stiffness of right shoulder, not elsewhere classified: Secondary | ICD-10-CM | POA: Diagnosis not present

## 2022-04-08 NOTE — Care Plan (Signed)
Ortho Bundle Case Management Note  Patient Details  Name: Whitney Campbell MRN: [id number] Date of Birth: 1935/11/05     spoke with patient. she will discharge to home with family to assist for a few days. No DME needs at this time. OPPT set up with [facility]. discharge instructions discussed with and mailed to patient. questions answered. appointment confirmed                DME Arranged:    DME Agency:     HH Arranged:    [place] Agency:     Additional Comments: Please contact me with any questions of if this plan should need to change.  Ladell Heads,  [place] Specialist  [phone] 04/08/2022, 11:43 AM

## 2022-04-12 ENCOUNTER — Other Ambulatory Visit: Payer: Self-pay | Admitting: Orthopaedic Surgery

## 2022-04-12 DIAGNOSIS — Z01818 Encounter for other preprocedural examination: Secondary | ICD-10-CM

## 2022-04-13 ENCOUNTER — Encounter (HOSPITAL_BASED_OUTPATIENT_CLINIC_OR_DEPARTMENT_OTHER)
Admission: RE | Admit: 2022-04-13 | Discharge: 2022-04-13 | Disposition: A | Discharge status: Home / Self Care | Payer: Medicare Other | Source: Ambulatory Visit | Attending: Orthopaedic Surgery | Admitting: Orthopaedic Surgery

## 2022-04-13 ENCOUNTER — Ambulatory Visit
Admission: RE | Admit: 2022-04-13 | Discharge: 2022-04-13 | Disposition: A | Discharge status: Home / Self Care | Payer: Medicare Other | Source: Ambulatory Visit | Attending: Orthopaedic Surgery | Admitting: Orthopaedic Surgery

## 2022-04-13 DIAGNOSIS — Z01818 Encounter for other preprocedural examination: Secondary | ICD-10-CM | POA: Insufficient documentation

## 2022-04-13 DIAGNOSIS — M25511 Pain in right shoulder: Secondary | ICD-10-CM | POA: Diagnosis not present

## 2022-04-13 LAB — SURGICAL PCR SCREEN
MRSA, PCR: NEGATIVE
Staphylococcus aureus: NEGATIVE

## 2022-04-13 NOTE — Progress Notes (Signed)
EKG reviewed by anesthesia. No new orders. Per Dr. Ermalene Postin, patient ok for surgery.

## 2022-04-13 NOTE — H&P (Cosign Needed Addendum)
PREOPERATIVE H&P  Chief Complaint: djd right shoulder  HPI: Whitney Campbell is a 86 y.o. female who is scheduled for Procedure(s): REVERSE SHOULDER ARTHROPLASTY.   Patient has a past medical history significant for GERD, HTN, breast cancer.   The patient is an 86 year old active female who comes in today for right shoulder pain.  She had a right shoulder scope with mini open rotator cuff repair by Dr. Ronnie Derby in 2018.  She notes that during this time he told her the tissue was chronically retracted, so he had difficulty with the repair.  She had pain off and on in the right shoulder after that.  She recently had foot surgery in May 2023 where she had weightbearing restrictions, so people were lifting her under her arms for several weeks, causing her to aggravate her shoulder.  She began full body physical therapy and her right shoulder kept getting worse.  She has had two injections, one 11/26/2021 and one 02/11/2022 with only five days of improvement.  She wakes up in the middle of the night with significant pain.  She is frustrated by her right shoulder.  She is right hand dominant.  She denies any numbness or tingling.  She also notes her left shoulder has started to bother her because she is relying on it more  Symptoms are rated as moderate to severe, and have been worsening.  This is significantly impairing activities of daily living.    Please see clinic note for further details on this patient's care.    She has elected for surgical management.   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   Sciatic pain    Stress bladder incontinence, female    Past  Surgical History:  Procedure Laterality Date   ABDOMINAL HYSTERECTOMY  84   BACK SURGERY     BACK SURGERY  2000   BIOPSY THYROID     bladder tuck     BREAST SURGERY Left 2012    lumpectomy   CATARACT EXTRACTION     bilateral   DENTAL SURGERY     implants   EYE SURGERY Bilateral 07   cataracts   LUNG SURGERY     1976   plantar fascitis Left    TOTAL KNEE ARTHROPLASTY Left 08/04/2015   Procedure: LEFT TOTAL KNEE ARTHROPLASTY;  Surgeon: Vickey Huger, MD;  Location: [facility];  Service: Orthopedics;  Laterality: Left;   TOTAL KNEE ARTHROPLASTY Right 11/10/2015   Procedure: RIGHT TOTAL KNEE ARTHROPLASTY;  Surgeon: Vickey Huger, MD;  Location: [facility];  Service: Orthopedics;  Laterality: Right;   VIDEO ASSISTED THORACOSCOPY (VATS) W/TALC PLEUADESIS Left 78   collapsed lung  patched   Social History   Socioeconomic History   Marital status: Divorced    Spouse name: Not on file   Number of children: Not on file   Years of education: Not on file   Highest education level: Not on file  Occupational History   Not on file  Tobacco Use   Smoking status: Never   Smokeless tobacco: Not on file  Substance and Sexual Activity   Alcohol use: No   Drug use: No   Sexual activity: Not on file  Other Topics Concern   Not on file  Social History Narrative   ** Merged History Encounter **       Social Determinants of Health   Financial Resource Strain: Not on file  Food Insecurity: Not on file  Transportation Needs: Not on file  Physical Activity: Not on file  Stress: Not on file  Social Connections: Not on file   History reviewed. No pertinent family history. Allergies  Allergen Reactions   Requip [Ropinirole Hcl] Other (See Comments)    Hallucinations at 4 mg strength   Morphine And Related Nausea And Vomiting   Prior to Admission medications   Medication Sig Start Date End Date Taking? Authorizing Provider  amLODipine (NORVASC) 10 MG tablet Take 10 mg by mouth daily. 07/15/19  Yes  [provider]  Ascorbic Acid (VITAMIN C) 100 MG tablet Take 100 mg by mouth daily.   Yes [provider]  Biotin 5000 MCG TABS Take 5,000 mcg by mouth daily. Reported on 10/27/2015   Yes [provider]  Cinnamon 500 MG capsule Take 500 mg by mouth daily as needed (for supplementation).    Yes [provider]  ibuprofen (ADVIL) 800 MG tablet Take 800 mg by mouth every 8 (eight) hours as needed. 05/18/19  Yes [provider]  magnesium gluconate (MAGONATE) 500 MG tablet Take 250 mg by mouth 2 (two) times daily.   Yes [provider]  vitamin B-12 (CYANOCOBALAMIN) 1000 MCG tablet Take 2,500 mcg by mouth daily.    Yes [provider]  vitamin E 400 UNIT capsule Take 400 Units by mouth daily.   Yes [provider]  rizatriptan (MAXALT) 10 MG tablet Take 10 mg by mouth as needed. May repeat in 2 hours if needed    [provider]  zinc gluconate 50 MG tablet Take 50 mg by mouth daily.    [provider]    ROS: All other systems have been reviewed and were otherwise negative with the exception of those mentioned in the HPI and as above.  Physical Exam: General: Alert, no acute distress Cardiovascular: No pedal edema Respiratory: No cyanosis, no use of accessory musculature GI: No organomegaly, abdomen is soft and non-tender Skin: No lesions in the area of chief complaint Neurologic: Sensation intact distally Psychiatric: Patient is competent for consent with normal mood and affect Lymphatic: No axillary or cervical lymphadenopathy  MUSCULOSKELETAL:  Examination of the right shoulder demonstrates active forward elevation to 90, passive to about 120.  External rotation to 30 degrees.  Internal rotation to the back pocket.  She has weakness with supraspinatus and infraspinatus testing.  Examination of the left shoulder demonstrates active forward elevation to 140 degrees.  External rotation to 60 degrees.   Internal rotation to about T12.  Cuff strength appears reasonable, but she does have pain with supraspinatus testing.    Imaging: Three views of the right shoulder demonstrate mild glenohumeral arthritis.  She does have proximal migration of the humeral  head consistent with rotator cuff arthropathy.    BMI: Estimated body mass index is 26.73 kg/m as calculated from the following:   Height as of this encounter: '5\' 9"'$  (1.753 m).   Weight as of this encounter: 82.1 kg.  Lab Results  Component Value Date   ALBUMIN 3.3 (L) 10/29/2015   Diabetes: Patient does not have a diagnosis  of diabetes.     Smoking Status:   reports that she has never smoked. She does not have any smokeless tobacco history on file.     Assessment: djd right shoulder  Plan: Plan for Procedure(s): REVERSE SHOULDER ARTHROPLASTY  The risks benefits and alternatives were discussed with the patient including but not limited to the risks of nonoperative treatment, versus surgical intervention including infection, bleeding, nerve injury,  blood clots, cardiopulmonary complications, morbidity, mortality, among others, and they were willing to proceed.   We additionally specifically discussed risks of axillary nerve injury, infection, periprosthetic fracture, continued pain and longevity of implants prior to beginning procedure.    Patient will be closely monitored in PACU for medical stabilization and pain control. If found stable in PACU, patient may be discharged home with outpatient follow-up. If any concerns regarding patient's stabilization patient will be admitted for observation after surgery. The patient is planning to be discharged home with outpatient PT.   The patient acknowledged the explanation, agreed to proceed with the plan and consent was signed.   She received operative clearance from her PCP, Dr. Venetia Maxon.   Operative Plan: Right reverse total shoulder arthroplasty Discharge Medications:  standard DVT Prophylaxis: aspirin Physical Therapy: outpatient PT Special Discharge needs: sling. Jeneen Montgomery, [place]  04/13/2022 5:55 PM

## 2022-04-13 NOTE — Progress Notes (Signed)
Patient given CHG soap and benzoyl peroxide gel per order with written and verbal instruction. Patient verbalized understanding.

## 2022-04-14 NOTE — Discharge Instructions (Addendum)
Post Anesthesia Home Care Instructions  Activity: Get plenty of rest for the remainder of the day. A responsible individual must stay with you for 24 hours following the procedure.  For the next 24 hours, DO NOT: -Drive a car -Paediatric nurse -Drink alcoholic beverages -Take any medication unless instructed by your physician -Make any legal decisions or sign important papers.  Meals: Start with liquid foods such as gelatin or soup. Progress to regular foods as tolerated. Avoid greasy, spicy, heavy foods. If nausea and/or vomiting occur, drink only clear liquids until the nausea and/or vomiting subsides. Call your physician if vomiting continues.  Special Instructions/Symptoms: Your throat may feel dry or sore from the anesthesia or the breathing tube placed in your throat during surgery. If this causes discomfort, gargle with warm salt water. The discomfort should disappear within 24 hours.  If you had a scopolamine patch placed behind your ear for the management of post- operative nausea and/or vomiting:  1. The medication in the patch is effective for 72 hours, after which it should be removed.  Wrap patch in a tissue and discard in the trash. Wash hands thoroughly with soap and water. 2. You may remove the patch earlier than 72 hours if you experience unpleasant side effects which may include dry mouth, dizziness or visual disturbances. 3. Avoid touching the patch. Wash your hands with soap and water after contact with the patch.      Regional Anesthesia Blocks  1. Numbness or the inability to move the "blocked" extremity may last from 3-48 hours after placement. The length of time depends on the medication injected and your individual response to the medication. If the numbness is not going away after 48 hours, call your surgeon.  2. The extremity that is blocked will need to be protected until the numbness is gone and the  Strength has returned. Because you cannot feel it, you  will need to take extra care to avoid injury. Because it may be weak, you may have difficulty moving it or using it. You may not know what position it is in without looking at it while the block is in effect.  3. For blocks in the legs and feet, returning to weight bearing and walking needs to be done carefully. You will need to wait until the numbness is entirely gone and the strength has returned. You should be able to move your leg and foot normally before you try and bear weight or walk. You will need someone to be with you when you first try to ensure you do not fall and possibly risk injury.  4. Bruising and tenderness at the needle site are common side effects and will resolve in a few days.  5. Persistent numbness or new problems with movement should be communicated to the surgeon or the [place] [facility] [place] [phone] [facility] [phone]).  Next dose ofTylenol may be taken at 1p   Ophelia Charter MD, MPH Noemi Chapel, PA-C [facility] [address], [address] [phone] (tel)   [phone] (fax)   [employer] may leave the operative dressing in place until your follow-up appointment. KEEP THE INCISIONS CLEAN AND DRY. There may be a small amount of fluid/bleeding leaking at the surgical site. This is normal after surgery.  If it fills with liquid or blood please call us immediately to change it for you. Use the provided ice machine or Ice packs  as often as possible for the first 3-4 days, then as needed for pain relief.   Keep a layer of cloth or a shirt between your skin and the cooling unit to prevent frost bite as it can get very cold.  SHOWERING: - You may shower on Post-Op Day #2.  - The dressing is water resistant but do not scrub it as it may start to peel up.   - You may remove the sling for showering - Gently pat the area dry.  - Do not soak the shoulder  in water.  - Do not go swimming in the pool or ocean until your incision has completely healed (about 4-6 weeks after surgery) - KEEP THE INCISIONS CLEAN AND DRY.  EXERCISES Wear the sling at all times  You may remove the sling for showering, but keep the arm across the chest or in a secondary sling.    Accidental/Purposeful External Rotation and shoulder flexion (reaching behind you) is to be avoided at all costs for the first month. It is ok to come out of your sling if your are sitting and have assistance for eating.   Do not lift anything heavier than 1 pound until we discuss it further in clinic.  It is normal for your fingers/hand to become more swollen after surgery and discolored from bruising.   This will resolve over the first few weeks usually after surgery. Please continue to ambulate and do not stay sitting or lying for too long.  Perform foot and wrist pumps to assist in circulation.  PHYSICAL THERAPY - You will begin physical therapy soon after surgery (unless otherwise specified) - Please call to set up an appointment, if you do not already have one   - A PT referral was sent to [facility] in [place] ([place]) The anesthesia team may have performed a nerve block for you this is a great tool used to minimize pain.   The block may start wearing off overnight (between 8-24 hours postop) When the block wears off, your pain may go from nearly zero to the pain you would have had postop without the block. This is an abrupt transition but nothing dangerous is happening.   This can be a challenging period but utilize your as needed pain medications to try and manage this period. We suggest you use the pain medication the first night prior to going to bed, to ease this transition.  You may take an extra dose of narcotic when this happens if needed   POST-OP MEDICATIONS- Multimodal approach to pain control In general your pain will be controlled  with a combination of substances.  Prescriptions unless otherwise discussed are electronically sent to your pharmacy.  This is a carefully made plan we use to minimize narcotic use.     Meloxicam - Anti-inflammatory medication taken on a scheduled basis Acetaminophen - Non-narcotic pain medicine taken on a scheduled basis  Oxycodone - This is a strong narcotic, to be used only on an "as needed" basis for SEVERE pain. Aspirin '81mg'$  - This medicine is used to minimize the risk of blood clots after surgery. Omeprazole - daily medicine to protect your stomach while taking anti-inflammatories.   Zofran -  take as needed for nausea   FOLLOW-UP If you develop a Fever (>101.5), Redness or Drainage from the surgical incision site, please call our office to arrange for an evaluation. Please call the office to schedule a follow-up appointment for a wound check,  7-10 days post-operatively.  IF YOU HAVE ANY QUESTIONS, PLEASE FEEL FREE TO CALL OUR OFFICE.  HELPFUL INFORMATION  Your arm will be in a sling following surgery. You will be in this sling for the next 4 weeks.   You may be more comfortable sleeping in a semi-seated position the first few nights following surgery.  Keep a pillow propped under the elbow and forearm for comfort.  If you have a recliner type of chair it might be beneficial.  If not that is fine too, but it would be helpful to sleep propped up with pillows behind your operated shoulder as well under your elbow and forearm.  This will reduce pulling on the suture lines.  When dressing, put your operative arm in the sleeve first.  When getting undressed, take your operative arm out last.  Loose fitting, button-down shirts are recommended.  In most states it is against the law to drive while your arm is in a sling. And certainly against the law to drive while taking narcotics.  You may return to work/school in the next couple of days when you feel up to it. Desk work and typing in the  sling is fine.  We suggest you use the pain medication the first night prior to going to bed, in order to ease any pain when the anesthesia wears off. You should avoid taking pain medications on an empty stomach as it will make you nauseous.  You should wean off your narcotic medicines as soon as you are able.     Most patients will be off or using minimal narcotics before their first postop appointment.   Do not drink alcoholic beverages or take illicit drugs when taking pain medications.  Pain medication may make you constipated.  Below are a few solutions to try in this order: Decrease the amount of pain medication if you aren't having pain. Drink lots of decaffeinated fluids. Drink prune juice and/or each dried prunes  If the first 3 don't work start with additional solutions Take Colace - an over-the-counter stool softener Take Senokot - an over-the-counter laxative Take Miralax - a stronger over-the-counter laxative   Dental Antibiotics:  In most cases prophylactic antibiotics for Dental procdeures after total joint surgery are not necessary.  Exceptions are as follows:  1. History of prior total joint infection  2. Severely immunocompromised (Organ Transplant, cancer chemotherapy, Rheumatoid biologic meds such as [place])  3. Poorly controlled diabetes (A1C &gt; 8.0, blood glucose over 200)  If you have one of these conditions, contact your surgeon for an antibiotic prescription, prior to your dental procedure.   For more information including helpful videos and documents visit our website:   [email or web address]

## 2022-04-14 NOTE — Anesthesia Preprocedure Evaluation (Signed)
Anesthesia Evaluation  Patient identified by MRN, date of birth, ID band Patient awake    Reviewed: Allergy & Precautions, NPO status , Patient's Chart, lab work & pertinent test results  Airway Mallampati: I  TM Distance: >3 FB Neck ROM: Full    Dental no notable dental hx. (+) Teeth Intact, Dental Advisory Given   Pulmonary  Hx recurrent pneumothoraces 1970s   Pulmonary exam normal breath sounds clear to auscultation       Cardiovascular hypertension (146/83 preop), Pt. on medications Normal cardiovascular exam Rhythm:Regular Rate:Normal     Neuro/Psych  Headaches PSYCHIATRIC DISORDERS  Depression       GI/Hepatic Neg liver ROS,GERD  Controlled,,  Endo/Other  negative endocrine ROS    Renal/GU negative Renal ROS  negative genitourinary   Musculoskeletal  (+) Arthritis , Osteoarthritis,    Abdominal   Peds  Hematology negative hematology ROS (+)   Anesthesia Other Findings   Reproductive/Obstetrics negative OB ROS                             Anesthesia Physical Anesthesia Plan  ASA: 2  Anesthesia Plan: General and Regional   Post-op Pain Management: Regional block* and Tylenol PO (pre-op)*   Induction: Intravenous  PONV Risk Score and Plan: 3 and Ondansetron, Dexamethasone, Midazolam and Treatment may vary due to age or medical condition  Airway Management Planned: Oral ETT  Additional Equipment: None  Intra-op Plan:   Post-operative Plan: Extubation in OR  Informed Consent: I have reviewed the patients History and Physical, chart, labs and discussed the procedure including the risks, benefits and alternatives for the proposed anesthesia with the patient or authorized representative who has indicated his/her understanding and acceptance.     Dental advisory given  Plan Discussed with: CRNA  Anesthesia Plan Comments:        Anesthesia Quick Evaluation

## 2022-04-15 ENCOUNTER — Other Ambulatory Visit: Payer: Self-pay

## 2022-04-15 ENCOUNTER — Ambulatory Visit (HOSPITAL_BASED_OUTPATIENT_CLINIC_OR_DEPARTMENT_OTHER): Payer: Medicare Other | Admitting: Certified Registered"

## 2022-04-15 ENCOUNTER — Encounter (HOSPITAL_BASED_OUTPATIENT_CLINIC_OR_DEPARTMENT_OTHER): Payer: Self-pay | Admitting: Orthopaedic Surgery

## 2022-04-15 ENCOUNTER — Ambulatory Visit (HOSPITAL_BASED_OUTPATIENT_CLINIC_OR_DEPARTMENT_OTHER)
Admission: RE | Admit: 2022-04-15 | Discharge: 2022-04-15 | Disposition: A | Discharge status: Home / Self Care | Payer: Medicare Other | Source: Ambulatory Visit | Attending: Orthopaedic Surgery | Admitting: Orthopaedic Surgery

## 2022-04-15 ENCOUNTER — Encounter (HOSPITAL_BASED_OUTPATIENT_CLINIC_OR_DEPARTMENT_OTHER)
Admission: RE | Disposition: A | Discharge status: Home / Self Care | Payer: Self-pay | Source: Ambulatory Visit | Attending: Orthopaedic Surgery

## 2022-04-15 ENCOUNTER — Ambulatory Visit (HOSPITAL_COMMUNITY): Payer: Medicare Other

## 2022-04-15 DIAGNOSIS — M75121 Complete rotator cuff tear or rupture of right shoulder, not specified as traumatic: Secondary | ICD-10-CM | POA: Insufficient documentation

## 2022-04-15 DIAGNOSIS — Z01818 Encounter for other preprocedural examination: Secondary | ICD-10-CM

## 2022-04-15 DIAGNOSIS — Z853 Personal history of malignant neoplasm of breast: Secondary | ICD-10-CM | POA: Diagnosis not present

## 2022-04-15 DIAGNOSIS — G8918 Other acute postprocedural pain: Secondary | ICD-10-CM | POA: Diagnosis not present

## 2022-04-15 DIAGNOSIS — X58XXXA Exposure to other specified factors, initial encounter: Secondary | ICD-10-CM | POA: Diagnosis not present

## 2022-04-15 DIAGNOSIS — M75101 Unspecified rotator cuff tear or rupture of right shoulder, not specified as traumatic: Secondary | ICD-10-CM | POA: Diagnosis not present

## 2022-04-15 DIAGNOSIS — Z96611 Presence of right artificial shoulder joint: Secondary | ICD-10-CM | POA: Diagnosis not present

## 2022-04-15 DIAGNOSIS — M19011 Primary osteoarthritis, right shoulder: Secondary | ICD-10-CM | POA: Diagnosis not present

## 2022-04-15 DIAGNOSIS — I10 Essential (primary) hypertension: Secondary | ICD-10-CM | POA: Diagnosis not present

## 2022-04-15 DIAGNOSIS — Z471 Aftercare following joint replacement surgery: Secondary | ICD-10-CM | POA: Diagnosis not present

## 2022-04-15 HISTORY — PX: REVERSE SHOULDER ARTHROPLASTY: SHX5054

## 2022-04-15 HISTORY — DX: Essential (primary) hypertension: I10

## 2022-04-15 SURGERY — ARTHROPLASTY, SHOULDER, TOTAL, REVERSE
Anesthesia: Regional | Site: Shoulder | Laterality: Right

## 2022-04-15 MED ORDER — CEFAZOLIN SODIUM-DEXTROSE 2-4 GM/100ML-% IV SOLN
2.0000 g | INTRAVENOUS | Status: AC
  Administered 2022-04-15: 2 g via INTRAVENOUS

## 2022-04-15 MED ORDER — GABAPENTIN 300 MG PO CAPS: 300.0000 mg | ORAL_CAPSULE | Freq: Once | ORAL | Status: DC

## 2022-04-15 MED ORDER — FENTANYL CITRATE (PF) 100 MCG/2ML IJ SOLN
100.0000 ug | Freq: Once | INTRAMUSCULAR | Status: AC
  Administered 2022-04-15: 100 ug via INTRAVENOUS

## 2022-04-15 MED ORDER — LIDOCAINE 2% (20 MG/ML) 5 ML SYRINGE
INTRAMUSCULAR | Status: DC | PRN
  Administered 2022-04-15: 40 mg via INTRAVENOUS

## 2022-04-15 MED ORDER — DEXAMETHASONE SODIUM PHOSPHATE 10 MG/ML IJ SOLN
INTRAMUSCULAR | Status: DC | PRN
  Administered 2022-04-15: 5 mg via INTRAVENOUS

## 2022-04-15 MED ORDER — FENTANYL CITRATE (PF) 100 MCG/2ML IJ SOLN
INTRAMUSCULAR | Status: AC
  Filled 2022-04-15: qty 2

## 2022-04-15 MED ORDER — ACETAMINOPHEN 500 MG PO TABS: 1000.0000 mg | ORAL_TABLET | Freq: Once | ORAL | Status: DC

## 2022-04-15 MED ORDER — PHENYLEPHRINE HCL (PRESSORS) 10 MG/ML IV SOLN
INTRAVENOUS | Status: AC
  Filled 2022-04-15: qty 1

## 2022-04-15 MED ORDER — PROPOFOL 10 MG/ML IV BOLUS
INTRAVENOUS | Status: AC
  Filled 2022-04-15: qty 20

## 2022-04-15 MED ORDER — ACETAMINOPHEN 500 MG PO TABS
1000.0000 mg | ORAL_TABLET | Freq: Once | ORAL | Status: AC
  Administered 2022-04-15: 1000 mg via ORAL

## 2022-04-15 MED ORDER — TRANEXAMIC ACID-NACL 1000-0.7 MG/100ML-% IV SOLN
1000.0000 mg | INTRAVENOUS | Status: AC
  Administered 2022-04-15: 1000 mg via INTRAVENOUS

## 2022-04-15 MED ORDER — ONDANSETRON HCL 4 MG/2ML IJ SOLN: 4.0000 mg | Freq: Once | INTRAMUSCULAR | Status: DC | PRN

## 2022-04-15 MED ORDER — ONDANSETRON HCL 4 MG PO TABS: 4.0000 mg | ORAL_TABLET | Freq: Three times a day (TID) | ORAL | 0 refills | Status: AC | PRN

## 2022-04-15 MED ORDER — MIDAZOLAM HCL 2 MG/2ML IJ SOLN
INTRAMUSCULAR | Status: AC
  Filled 2022-04-15: qty 2

## 2022-04-15 MED ORDER — BUPIVACAINE LIPOSOME 1.3 % IJ SUSP
INTRAMUSCULAR | Status: DC | PRN
  Administered 2022-04-15: 10 mL via PERINEURAL

## 2022-04-15 MED ORDER — OXYCODONE HCL 5 MG PO TABS: ORAL_TABLET | ORAL | 0 refills | Status: AC

## 2022-04-15 MED ORDER — ONDANSETRON HCL 4 MG/2ML IJ SOLN
INTRAMUSCULAR | Status: DC | PRN
  Administered 2022-04-15: 4 mg via INTRAVENOUS

## 2022-04-15 MED ORDER — PHENYLEPHRINE HCL-NACL 20-0.9 MG/250ML-% IV SOLN
INTRAVENOUS | Status: DC | PRN
  Administered 2022-04-15: 15 ug/min via INTRAVENOUS

## 2022-04-15 MED ORDER — DEXAMETHASONE SODIUM PHOSPHATE 10 MG/ML IJ SOLN
INTRAMUSCULAR | Status: AC
  Filled 2022-04-15: qty 1

## 2022-04-15 MED ORDER — AMISULPRIDE (ANTIEMETIC) 5 MG/2ML IV SOLN: 10.0000 mg | Freq: Once | INTRAVENOUS | Status: DC | PRN

## 2022-04-15 MED ORDER — ROCURONIUM BROMIDE 100 MG/10ML IV SOLN
INTRAVENOUS | Status: DC | PRN
  Administered 2022-04-15: 50 mg via INTRAVENOUS

## 2022-04-15 MED ORDER — PROPOFOL 10 MG/ML IV BOLUS
INTRAVENOUS | Status: DC | PRN
  Administered 2022-04-15: 100 mg via INTRAVENOUS

## 2022-04-15 MED ORDER — FENTANYL CITRATE (PF) 100 MCG/2ML IJ SOLN: 25.0000 ug | INTRAMUSCULAR | Status: DC | PRN

## 2022-04-15 MED ORDER — TRANEXAMIC ACID-NACL 1000-0.7 MG/100ML-% IV SOLN
INTRAVENOUS | Status: AC
  Filled 2022-04-15: qty 100

## 2022-04-15 MED ORDER — OMEPRAZOLE 20 MG PO CPDR: 20.0000 mg | DELAYED_RELEASE_CAPSULE | Freq: Every day | ORAL | 0 refills | Status: AC

## 2022-04-15 MED ORDER — ONDANSETRON HCL 4 MG/2ML IJ SOLN
INTRAMUSCULAR | Status: AC
  Filled 2022-04-15: qty 2

## 2022-04-15 MED ORDER — ROCURONIUM BROMIDE 10 MG/ML (PF) SYRINGE
PREFILLED_SYRINGE | INTRAVENOUS | Status: AC
  Filled 2022-04-15: qty 10

## 2022-04-15 MED ORDER — LACTATED RINGERS IV BOLUS
250.0000 mL | Freq: Once | INTRAVENOUS | Status: AC
  Administered 2022-04-15: 250 mL via INTRAVENOUS

## 2022-04-15 MED ORDER — ASPIRIN 81 MG PO CHEW: 81.0000 mg | CHEWABLE_TABLET | Freq: Two times a day (BID) | ORAL | 0 refills | Status: AC

## 2022-04-15 MED ORDER — MELOXICAM 15 MG PO TABS: 15.0000 mg | ORAL_TABLET | Freq: Every day | ORAL | 0 refills | Status: DC

## 2022-04-15 MED ORDER — LIDOCAINE 2% (20 MG/ML) 5 ML SYRINGE
INTRAMUSCULAR | Status: AC
  Filled 2022-04-15: qty 5

## 2022-04-15 MED ORDER — VANCOMYCIN HCL 1000 MG IV SOLR
INTRAVENOUS | Status: AC
  Filled 2022-04-15: qty 40

## 2022-04-15 MED ORDER — BUPIVACAINE HCL (PF) 0.25 % IJ SOLN
INTRAMUSCULAR | Status: AC
  Filled 2022-04-15: qty 30

## 2022-04-15 MED ORDER — ATROPINE SULFATE 0.4 MG/ML IV SOLN
INTRAVENOUS | Status: AC
  Filled 2022-04-15: qty 1

## 2022-04-15 MED ORDER — ACETAMINOPHEN 500 MG PO TABS: 1000.0000 mg | ORAL_TABLET | Freq: Three times a day (TID) | ORAL | 0 refills | Status: AC

## 2022-04-15 MED ORDER — SUGAMMADEX SODIUM 200 MG/2ML IV SOLN
INTRAVENOUS | Status: DC | PRN
  Administered 2022-04-15: 200 mg via INTRAVENOUS

## 2022-04-15 MED ORDER — VANCOMYCIN HCL 1000 MG IV SOLR
INTRAVENOUS | Status: DC | PRN
  Administered 2022-04-15: 1000 mg via TOPICAL

## 2022-04-15 MED ORDER — LACTATED RINGERS IV SOLN: INTRAVENOUS | Status: DC

## 2022-04-15 MED ORDER — OXYCODONE HCL 5 MG/5ML PO SOLN: 5.0000 mg | Freq: Once | ORAL | Status: DC | PRN

## 2022-04-15 MED ORDER — LACTATED RINGERS IV BOLUS
500.0000 mL | Freq: Once | INTRAVENOUS | Status: AC
  Administered 2022-04-15: 500 mL via INTRAVENOUS

## 2022-04-15 MED ORDER — ACETAMINOPHEN 500 MG PO TABS
ORAL_TABLET | ORAL | Status: AC
  Filled 2022-04-15: qty 2

## 2022-04-15 MED ORDER — ROPIVACAINE HCL 5 MG/ML IJ SOLN
INTRAMUSCULAR | Status: DC | PRN
  Administered 2022-04-15: 15 mL via PERINEURAL

## 2022-04-15 MED ORDER — 0.9 % SODIUM CHLORIDE (POUR BTL) OPTIME
TOPICAL | Status: DC | PRN
  Administered 2022-04-15: 1000 mL

## 2022-04-15 MED ORDER — OXYCODONE HCL 5 MG PO TABS: 5.0000 mg | ORAL_TABLET | Freq: Once | ORAL | Status: DC | PRN

## 2022-04-15 MED ORDER — CEFAZOLIN SODIUM-DEXTROSE 2-4 GM/100ML-% IV SOLN
INTRAVENOUS | Status: AC
  Filled 2022-04-15: qty 100

## 2022-04-15 SURGICAL SUPPLY — 67 items
AID PSTN UNV HD RSTRNT DISP (MISCELLANEOUS) ×1
APL PRP STRL LF DISP 70% ISPRP (MISCELLANEOUS) ×1
AUG BASEPLATE 15DEG 25 WEDGE (Joint) ×1 IMPLANT
AUGMENT BASEPLATE 15DEG 25 WDG (Joint) IMPLANT
BIT DRILL 3.2 PERIPHERAL SCREW (BIT) IMPLANT
BLADE SAW SGTL 73X25 THK (BLADE) ×1 IMPLANT
BLADE SURG 10 STRL SS (BLADE) IMPLANT
BLADE SURG 15 STRL LF DISP TIS (BLADE) IMPLANT
BLADE SURG 15 STRL SS (BLADE)
BRUSH SCRUB EZ PLAIN DRY (MISCELLANEOUS) ×1 IMPLANT
BSPLAT GLND 15D 25 FULL WDG (Joint) ×1 IMPLANT
CHLORAPREP W/TINT 26 (MISCELLANEOUS) ×1 IMPLANT
CLSR STERI-STRIP ANTIMIC 1/2X4 (GAUZE/BANDAGES/DRESSINGS) ×1 IMPLANT
COOLER ICEMAN CLASSIC (MISCELLANEOUS) ×1 IMPLANT
COVER BACK TABLE 60X90IN (DRAPES) ×1 IMPLANT
COVER MAYO STAND STRL (DRAPES) ×1 IMPLANT
DRAPE IMP U-DRAPE 54X76 (DRAPES) IMPLANT
DRAPE INCISE IOBAN 66X45 STRL (DRAPES) ×1 IMPLANT
DRAPE POUCH INSTRU U-SHP 10X18 (DRAPES) ×1 IMPLANT
DRAPE U-SHAPE 76X120 STRL (DRAPES) ×2 IMPLANT
DRSG AQUACEL AG ADV 3.5X 6 (GAUZE/BANDAGES/DRESSINGS) ×1 IMPLANT
ELECT BLADE 4.0 EZ CLEAN MEGAD (MISCELLANEOUS) ×1
ELECT REM PT RETURN 9FT ADLT (ELECTROSURGICAL) ×1
ELECTRODE BLDE 4.0 EZ CLN MEGD (MISCELLANEOUS) ×1 IMPLANT
ELECTRODE REM PT RTRN 9FT ADLT (ELECTROSURGICAL) ×1 IMPLANT
FACESHIELD WRAPAROUND (MASK) ×2 IMPLANT
FACESHIELD WRAPAROUND OR TEAM (MASK) ×2 IMPLANT
GLENOSPHERE REV SHOULDER 36 (Joint) IMPLANT
GLOVE BIO SURGEON STRL SZ 6.5 (GLOVE) ×2 IMPLANT
GLOVE BIOGEL PI IND STRL 6.5 (GLOVE) ×1 IMPLANT
GLOVE BIOGEL PI IND STRL 8 (GLOVE) ×1 IMPLANT
GLOVE ECLIPSE 8.0 STRL XLNG CF (GLOVE) ×2 IMPLANT
GOWN STRL REUS W/ TWL LRG LVL3 (GOWN DISPOSABLE) ×2 IMPLANT
GOWN STRL REUS W/TWL LRG LVL3 (GOWN DISPOSABLE) ×2
GOWN STRL REUS W/TWL XL LVL3 (GOWN DISPOSABLE) ×1 IMPLANT
GUIDE PIN 3X75 SHOULDER (PIN) ×1
GUIDEWIRE GLENOID 2.5X220 (WIRE) IMPLANT
HANDPIECE INTERPULSE COAX TIP (DISPOSABLE) ×1
INSERT HUMERAL +3 SZ1/2 .36 (Joint) IMPLANT
KIT STABILIZATION SHOULDER (MISCELLANEOUS) ×1 IMPLANT
MANIFOLD NEPTUNE II (INSTRUMENTS) ×1 IMPLANT
PACK BASIN DAY SURGERY FS (CUSTOM PROCEDURE TRAY) ×1 IMPLANT
PACK SHOULDER (CUSTOM PROCEDURE TRAY) ×1 IMPLANT
PAD COLD SHLDR WRAP-ON (PAD) ×1 IMPLANT
PIN GUIDE 3X75 SHOULDER (PIN) IMPLANT
RESTRAINT HEAD UNIVERSAL NS (MISCELLANEOUS) ×1 IMPLANT
SCREW 5.0X18 (Screw) IMPLANT
SCREW 5.0X38 SMALL F/PERFORM (Screw) IMPLANT
SCREW 5.5X22 (Screw) IMPLANT
SCREW BONE 6.5X40 SM (Screw) IMPLANT
SET HNDPC FAN SPRY TIP SCT (DISPOSABLE) ×1 IMPLANT
SHEET MEDIUM DRAPE 40X70 STRL (DRAPES) ×1 IMPLANT
SLEEVE SCD COMPRESS KNEE MED (STOCKING) ×1 IMPLANT
SPIKE FLUID TRANSFER (MISCELLANEOUS) IMPLANT
SPONGE T-LAP 18X18 ~~LOC~~+RFID (SPONGE) ×1 IMPLANT
STEM HUMERAL PLUS SHORT SZ2+ (Orthopedic Implant) IMPLANT
SUT ETHIBOND 2 V 37 (SUTURE) ×1 IMPLANT
SUT ETHIBOND NAB CT1 #1 30IN (SUTURE) ×1 IMPLANT
SUT FIBERWIRE #5 38 CONV NDL (SUTURE) ×4
SUT MNCRL AB 4-0 PS2 18 (SUTURE) ×1 IMPLANT
SUT VIC AB 0 CT1 27 (SUTURE)
SUT VIC AB 0 CT1 27XBRD ANBCTR (SUTURE) IMPLANT
SUT VIC AB 3-0 SH 27 (SUTURE) ×1
SUT VIC AB 3-0 SH 27X BRD (SUTURE) ×1 IMPLANT
SUTURE FIBERWR #5 38 CONV NDL (SUTURE) ×4 IMPLANT
TOWEL GREEN STERILE FF (TOWEL DISPOSABLE) ×3 IMPLANT
TUBE SUCTION HIGH CAP CLEAR NV (SUCTIONS) ×1 IMPLANT

## 2022-04-15 NOTE — Transfer of Care (Signed)
Immediate Anesthesia Transfer of Care Note  Patient: Whitney Campbell  Procedure(s) Performed: REVERSE SHOULDER ARTHROPLASTY (Right: Shoulder)  Patient Location: PACU  Anesthesia Type:GA combined with regional for post-op pain  Level of Consciousness: drowsy  Airway & Oxygen Therapy: Patient Spontanous Breathing and Patient connected to face mask oxygen  Post-op Assessment: Report given to RN and Post -op Vital signs reviewed and stable  Post vital signs: Reviewed and stable  Last Vitals:  Vitals Value Taken Time  BP 127/58 04/15/22 0915  Temp 36.7 C 04/15/22 0904  Pulse 70 04/15/22 0925  Resp 18 04/15/22 0925  SpO2 99 % 04/15/22 0925  Vitals shown include unvalidated device data.  Last Pain:  Vitals:   04/15/22 0904  TempSrc:   PainSc: 0-No pain         Complications: No notable events documented.

## 2022-04-15 NOTE — Progress Notes (Signed)
Assisted Dr. Doroteo Glassman with right, interscalene  block. Side rails up, monitors on throughout procedure. See vital signs in flow sheet. Tolerated Procedure well.

## 2022-04-15 NOTE — Anesthesia Postprocedure Evaluation (Signed)
Anesthesia Post Note  Patient: Whitney Campbell  Procedure(s) Performed: REVERSE SHOULDER ARTHROPLASTY (Right: Shoulder)     Patient location during evaluation: PACU Anesthesia Type: Regional and General Level of consciousness: awake and alert, oriented and patient cooperative Pain management: pain level controlled Vital Signs Assessment: post-procedure vital signs reviewed and stable Respiratory status: spontaneous breathing, nonlabored ventilation and respiratory function stable Cardiovascular status: blood pressure returned to baseline and stable Postop Assessment: no apparent nausea or vomiting Anesthetic complications: no   No notable events documented.  Last Vitals:  Vitals:   04/15/22 0915 04/15/22 0930  BP: (!) 127/58 (!) 118/56  Pulse: 67 66  Resp: 15 15  Temp:    SpO2: 99% 100%    Last Pain:  Vitals:   04/15/22 0930  TempSrc:   PainSc: 0-No pain                 Pervis Hocking

## 2022-04-15 NOTE — Interval H&P Note (Signed)
All questions answered, patient wants to proceed with procedure. ? ?

## 2022-04-15 NOTE — Anesthesia Procedure Notes (Signed)
Anesthesia Regional Block: Interscalene brachial plexus block   Pre-Anesthetic Checklist: , timeout performed,  Correct Patient, Correct Site, Correct Laterality,  Correct Procedure, Correct Position, site marked,  Risks and benefits discussed,  Surgical consent,  Pre-op evaluation,  At surgeon's request and post-op pain management  Laterality: Right  Prep: Maximum Sterile Barrier Precautions used, chloraprep       Needles:  Injection technique: Single-shot  Needle Type: Echogenic Stimulator Needle     Needle Length: 9cm  Needle Gauge: 22     Additional Needles:   Procedures:,,,, ultrasound used (permanent image in chart),,    Narrative:  Start time: 04/15/2022 7:10 AM End time: 04/15/2022 7:15 AM Injection made incrementally with aspirations every 5 mL.  Performed by: Personally  Anesthesiologist: Pervis Hocking, DO  Additional Notes: Monitors applied. No increased pain on injection. No increased resistance to injection. Injection made in 5cc increments. Good needle visualization. Patient tolerated procedure well.

## 2022-04-15 NOTE — Anesthesia Procedure Notes (Signed)
Procedure Name: Intubation Date/Time: 04/15/2022 7:51 AM  Performed by: Lavonia Dana, CRNAPre-anesthesia Checklist: Patient identified, Emergency Drugs available, Suction available and Patient being monitored Patient Re-evaluated:Patient Re-evaluated prior to induction Oxygen Delivery Method: Circle system utilized Preoxygenation: Pre-oxygenation with 100% oxygen Induction Type: IV induction Ventilation: Mask ventilation without difficulty Laryngoscope Size: Mac and 3 Grade View: Grade I Tube type: Oral Tube size: 7.0 mm Number of attempts: 1 Airway Equipment and Method: Stylet and Bite block Placement Confirmation: ETT inserted through vocal cords under direct vision, positive ETCO2 and breath sounds checked- equal and bilateral Secured at: 22 cm Tube secured with: Tape Dental Injury: Teeth and Oropharynx as per pre-operative assessment

## 2022-04-15 NOTE — Op Note (Signed)
Orthopaedic Surgery Operative Note (CSN: [id number])  MARK BENECKE  [birth year] Date of Surgery: 04/15/2022   Diagnoses:  Right shoulder irreparable rotator cuff tear  Procedure: Right reverse lateralized augmented total Shoulder Arthroplasty   Operative Finding Successful completion of planned procedure.  Patient had significant posterior superior wear and we used a full wedge implant.  Rotator cuff was completely torn superiorly.  Moderate bone quality at best.  Skin was thin and we used nylon suture.  Post-operative plan: The patient will be NWB in sling.  The patient will be will be discharged from PACU if continues to be stable as was plan prior to surgery.  DVT prophylaxis Aspirin 81 mg twice daily for 6 weeks.  Pain control with PRN pain medication preferring oral medicines.  Follow up plan will be scheduled in approximately 7 days for incision check and XR.  Physical therapy to start about a week after surgery.  Implants: Tornier perform humeral 2+ stem, +3 polyethylene, 36 standard glenosphere with a full wedge 25 baseplate and a 40 center screw with 4 peripheral screws  Post-Op Diagnosis: Same Surgeons:Primary: Hiram Gash, MD Assistants:Caroline McBane PA-C Location: [facility] OR ROOM 6 Anesthesia: General with Exparel Interscalene Antibiotics: Ancef 2g preop, Vancomycin '1000mg'$  locally Tourniquet time: None Estimated Blood Loss: 809 Complications: None Specimens: None Implants: Implant Name Type Inv. Item Serial No. Manufacturer Lot No. LRB No. Used Action  GLENOSPHERE REV SHOULDER 36 - [id number] Joint GLENOSPHERE REV SHOULDER 36 [id number] [employer]  Right 1 Implanted  AUG BASEPLATE 15DEG 25 WEDGE - [id number] Joint AUG BASEPLATE 15DEG 25 WEDGE [id number] [employer]  Right 1 Implanted  SCREW BONE 6.5X40 SM - [id number] Screw SCREW BONE 6.5X40 SM  [employer]  Right 1 Implanted  SCREW 5.0X38 SMALL F/PERFORM - [id number] Screw SCREW 5.0X38 SMALL F/PERFORM  [employer]   Right 1 Implanted  SCREW 5.0X18 - [id number] Screw SCREW 5.0X18  [employer]  Right 1 Implanted  SCREW 5.5X22 - [id number] Screw SCREW 5.5X22  [employer]  Right 2 Implanted  INSERT HUMERAL +3 SZ1/2 .36 - [id number] Joint INSERT HUMERAL +3 SZ1/2 .36 [id number] [employer]  Right 1 Implanted  STEM HUMERAL PLUS SHORT SZ2+ - [id number] Orthopedic Implant STEM HUMERAL PLUS SHORT SZ2+ [id number] [employer]  Right 1 Implanted    Indications for Surgery:   Whitney Campbell is a 86 y.o. female with rotator cuff arthropathy failed previous repair.  Benefits and risks of operative and nonoperative management were discussed prior to surgery with patient/guardian(s) and informed consent form was completed.  Infection and need for further surgery were discussed as was prosthetic stability and cuff issues.  We additionally specifically discussed risks of axillary nerve injury, infection, periprosthetic fracture, continued pain and longevity of implants prior to beginning procedure.      Procedure:   The patient was identified in the preoperative holding area where the surgical site was marked. Block placed by anesthesia with exparel.  The patient was taken to the OR where a procedural timeout was called and the above noted anesthesia was induced.  The patient was positioned beachchair on allen table with spider arm positioner.  Preoperative antibiotics were dosed.  The patient's right shoulder was prepped and draped in the usual sterile fashion.  A second preoperative timeout was called.       Standard deltopectoral approach was performed with a #10 blade. We dissected down to the subcutaneous tissues and the cephalic vein was taken laterally with the deltoid. Clavipectoral fascia was  incised in line with the incision. Deep retractors were placed. The long of the biceps tendon was identified and there was significant tenosynovitis present.  Tenodesis was performed to the pectoralis tendon with #2 Ethibond. The  remaining biceps was followed up into the rotator interval where it was released.   The subscapularis was taken down in a full thickness layer with capsule along the humeral neck extending inferiorly around the humeral head. We continued releasing the capsule directly off of the osteophytes inferiorly all the way around the corner. This allowed [place] to dislocate the humeral head.   The humeral head had evidence of severe osteoarthritic wear with full-thickness cartilage loss and exposed subchondral bone. There was significant flattening of the humeral head.   The rotator cuff was carefully examined and noted to be irreperably torn.  The decision was confirmed that a reverse total shoulder was indicated for this patient.  There were osteophytes along the inferior humeral neck. The osteophytes were removed with an osteotome and a rongeur.  Osteophytes were removed with a rongeur and an osteotome and the anatomic neck was well visualized.     A humeral cutting guide was used extra medullary with a pin to help control version. The version was set at 20 of retroversion. Humeral osteotomy was performed with an oscillating saw. The head fragment was passed off the back table.  A cut protector plate was placed.  The subscapularis was again identified and immediately we took care to palpate the axillary nerve anteriorly and verify its position with gentle palpation as well as the tug test.  We then released the SGHL with bovie cautery prior to placing a curved mayo at the junction of the anterior glenoid well above the axillary nerve and bluntly dissecting the subscapularis from the capsule.  We then carefully protected the axillary nerve as we gently released the inferior capsule to fully mobilize the subscapularis.  An anterior deltoid retractor was then placed as well as a small Hohmann retractor superiorly.   The glenoid was inspected and had evidence of severe osteoarthritic wear with full-thickness  cartilage loss and exposed subchondral bone.   The remaining labrum was removed circumferentially taking great care not to disrupt the posterior capsule.   At this point we felt based on blueprint templating that a full wedge augment was necessary.  We began by using a full wedge guide to place our center pin as was templated.  We had good position of this pin and we proceeded with our starter center drill.  This allowed for [place] to use the 15 degree full wedge reamer obtaining circumferential witness marks and good bone preparation for ingrowth.  At this point we proceeded with our center drill and had an intact vault.  We then drilled our center screw to a length of 40 mm.    We selected a 6.5 mm x 40 mm screw and the full wedge baseplate which was placed in the same orientation as our reaming.  We double checked that we had good apposition of the base plate to bone and then proceeded to place 3 locking screws and one nonlocking screw as is typical.   Next a 36 mm glenosphere was selected and impacted onto the baseplate. The center screw was tightened.  We turned attention back to the humeral side. The cut protector was removed.  We used the perform humeral sizing block to select the appropriate size which for this patient was a 2.  We then placed our center  pin and reamed over it concentrically obtaining appropriate inset.  We then used our lateralizing chisel to prepare the lateral aspect of the humerus.  At that point we selected the appropriate implant trialing a 2+.  Using this trial implant we trialed multiple polyethylene sizes settling on a plus 3 which provided good stability and range of motion without excess soft tissue tension. The offset was dialed in to match the normal anatomy. The shoulder was trialed.  There was good ROM in all planes and the shoulder was stable with no inferior translation.  The real humeral implants were opened after again confirming sizes.  The trial was removed. #5  Fiberwire x4 sutures passed through the humeral neck for subscap repair. The humeral component was press-fit obtaining a secure fit. The joint was reduced and thoroughly irrigated with pulsatile lavage. Subscap was repaired back with #5 Fiberwire sutures through bone tunnels. Hemostasis was obtained. The deltopectoral interval was reapproximated with #1 Ethibond. The subcutaneous tissues were closed with 2-0 Vicryl and the skin was closed with running monocryl.    The wounds were cleaned and dried and an Aquacel dressing was placed. The drapes taken down. The arm was placed into sling with abduction pillow. Patient was awakened, extubated, and transferred to the recovery room in stable condition. There were no intraoperative complications. The sponge, needle, and attention counts were  correct at the end of the case.     Noemi Chapel, PA-C, present and scrubbed throughout the case, critical for completion in a timely fashion, and for retraction, instrumentation, closure.

## 2022-04-16 ENCOUNTER — Encounter (HOSPITAL_BASED_OUTPATIENT_CLINIC_OR_DEPARTMENT_OTHER): Payer: Self-pay | Admitting: Orthopaedic Surgery

## 2022-04-20 DIAGNOSIS — M19011 Primary osteoarthritis, right shoulder: Secondary | ICD-10-CM | POA: Diagnosis not present

## 2022-04-21 DIAGNOSIS — M25611 Stiffness of right shoulder, not elsewhere classified: Secondary | ICD-10-CM | POA: Diagnosis not present

## 2022-04-21 DIAGNOSIS — M25511 Pain in right shoulder: Secondary | ICD-10-CM | POA: Diagnosis not present

## 2022-04-27 DIAGNOSIS — M25611 Stiffness of right shoulder, not elsewhere classified: Secondary | ICD-10-CM | POA: Diagnosis not present

## 2022-04-27 DIAGNOSIS — M25511 Pain in right shoulder: Secondary | ICD-10-CM | POA: Diagnosis not present

## 2022-04-29 DIAGNOSIS — M25511 Pain in right shoulder: Secondary | ICD-10-CM | POA: Diagnosis not present

## 2022-04-29 DIAGNOSIS — M25611 Stiffness of right shoulder, not elsewhere classified: Secondary | ICD-10-CM | POA: Diagnosis not present

## 2022-05-04 DIAGNOSIS — M19011 Primary osteoarthritis, right shoulder: Secondary | ICD-10-CM | POA: Diagnosis not present

## 2022-05-04 DIAGNOSIS — M25511 Pain in right shoulder: Secondary | ICD-10-CM | POA: Diagnosis not present

## 2022-05-04 DIAGNOSIS — M25611 Stiffness of right shoulder, not elsewhere classified: Secondary | ICD-10-CM | POA: Diagnosis not present

## 2022-05-06 DIAGNOSIS — M25511 Pain in right shoulder: Secondary | ICD-10-CM | POA: Diagnosis not present

## 2022-05-06 DIAGNOSIS — M25611 Stiffness of right shoulder, not elsewhere classified: Secondary | ICD-10-CM | POA: Diagnosis not present

## 2022-05-11 DIAGNOSIS — M25511 Pain in right shoulder: Secondary | ICD-10-CM | POA: Diagnosis not present

## 2022-05-11 DIAGNOSIS — M25611 Stiffness of right shoulder, not elsewhere classified: Secondary | ICD-10-CM | POA: Diagnosis not present

## 2022-05-13 DIAGNOSIS — M25511 Pain in right shoulder: Secondary | ICD-10-CM | POA: Diagnosis not present

## 2022-05-13 DIAGNOSIS — M25611 Stiffness of right shoulder, not elsewhere classified: Secondary | ICD-10-CM | POA: Diagnosis not present

## 2022-05-17 DIAGNOSIS — M25611 Stiffness of right shoulder, not elsewhere classified: Secondary | ICD-10-CM | POA: Diagnosis not present

## 2022-05-17 DIAGNOSIS — M25511 Pain in right shoulder: Secondary | ICD-10-CM | POA: Diagnosis not present

## 2022-05-21 DIAGNOSIS — M25511 Pain in right shoulder: Secondary | ICD-10-CM | POA: Diagnosis not present

## 2022-05-21 DIAGNOSIS — M25611 Stiffness of right shoulder, not elsewhere classified: Secondary | ICD-10-CM | POA: Diagnosis not present

## 2022-05-25 DIAGNOSIS — M25511 Pain in right shoulder: Secondary | ICD-10-CM | POA: Diagnosis not present

## 2022-05-25 DIAGNOSIS — M19011 Primary osteoarthritis, right shoulder: Secondary | ICD-10-CM | POA: Diagnosis not present

## 2022-05-25 DIAGNOSIS — M25611 Stiffness of right shoulder, not elsewhere classified: Secondary | ICD-10-CM | POA: Diagnosis not present

## 2022-05-27 DIAGNOSIS — M25511 Pain in right shoulder: Secondary | ICD-10-CM | POA: Diagnosis not present

## 2022-05-27 DIAGNOSIS — M25611 Stiffness of right shoulder, not elsewhere classified: Secondary | ICD-10-CM | POA: Diagnosis not present

## 2022-06-01 DIAGNOSIS — M25611 Stiffness of right shoulder, not elsewhere classified: Secondary | ICD-10-CM | POA: Diagnosis not present

## 2022-06-01 DIAGNOSIS — M25511 Pain in right shoulder: Secondary | ICD-10-CM | POA: Diagnosis not present

## 2022-06-03 DIAGNOSIS — M25611 Stiffness of right shoulder, not elsewhere classified: Secondary | ICD-10-CM | POA: Diagnosis not present

## 2022-06-03 DIAGNOSIS — M25511 Pain in right shoulder: Secondary | ICD-10-CM | POA: Diagnosis not present

## 2022-06-07 DIAGNOSIS — M25511 Pain in right shoulder: Secondary | ICD-10-CM | POA: Diagnosis not present

## 2022-06-07 DIAGNOSIS — M25611 Stiffness of right shoulder, not elsewhere classified: Secondary | ICD-10-CM | POA: Diagnosis not present

## 2022-06-10 DIAGNOSIS — M25511 Pain in right shoulder: Secondary | ICD-10-CM | POA: Diagnosis not present

## 2022-06-10 DIAGNOSIS — M25611 Stiffness of right shoulder, not elsewhere classified: Secondary | ICD-10-CM | POA: Diagnosis not present

## 2022-06-14 DIAGNOSIS — M25511 Pain in right shoulder: Secondary | ICD-10-CM | POA: Diagnosis not present

## 2022-06-14 DIAGNOSIS — M25611 Stiffness of right shoulder, not elsewhere classified: Secondary | ICD-10-CM | POA: Diagnosis not present

## 2022-06-16 DIAGNOSIS — M25611 Stiffness of right shoulder, not elsewhere classified: Secondary | ICD-10-CM | POA: Diagnosis not present

## 2022-06-16 DIAGNOSIS — M25511 Pain in right shoulder: Secondary | ICD-10-CM | POA: Diagnosis not present

## 2022-06-21 DIAGNOSIS — R2689 Other abnormalities of gait and mobility: Secondary | ICD-10-CM | POA: Diagnosis not present

## 2022-06-21 DIAGNOSIS — M1991 Primary osteoarthritis, unspecified site: Secondary | ICD-10-CM | POA: Diagnosis not present

## 2022-06-21 DIAGNOSIS — M19079 Primary osteoarthritis, unspecified ankle and foot: Secondary | ICD-10-CM | POA: Diagnosis not present

## 2022-06-21 DIAGNOSIS — M6281 Muscle weakness (generalized): Secondary | ICD-10-CM | POA: Diagnosis not present

## 2022-06-22 DIAGNOSIS — M25511 Pain in right shoulder: Secondary | ICD-10-CM | POA: Diagnosis not present

## 2022-06-22 DIAGNOSIS — M25611 Stiffness of right shoulder, not elsewhere classified: Secondary | ICD-10-CM | POA: Diagnosis not present

## 2022-06-24 DIAGNOSIS — M25511 Pain in right shoulder: Secondary | ICD-10-CM | POA: Diagnosis not present

## 2022-06-24 DIAGNOSIS — M25611 Stiffness of right shoulder, not elsewhere classified: Secondary | ICD-10-CM | POA: Diagnosis not present

## 2022-06-29 DIAGNOSIS — M25611 Stiffness of right shoulder, not elsewhere classified: Secondary | ICD-10-CM | POA: Diagnosis not present

## 2022-06-29 DIAGNOSIS — M25511 Pain in right shoulder: Secondary | ICD-10-CM | POA: Diagnosis not present

## 2022-07-01 DIAGNOSIS — M25511 Pain in right shoulder: Secondary | ICD-10-CM | POA: Diagnosis not present

## 2022-07-01 DIAGNOSIS — M25611 Stiffness of right shoulder, not elsewhere classified: Secondary | ICD-10-CM | POA: Diagnosis not present

## 2022-07-02 ENCOUNTER — Ambulatory Visit (INDEPENDENT_AMBULATORY_CARE_PROVIDER_SITE_OTHER): Payer: Medicare Other | Admitting: Podiatry

## 2022-07-02 DIAGNOSIS — B351 Tinea unguium: Secondary | ICD-10-CM

## 2022-07-02 DIAGNOSIS — M79676 Pain in unspecified toe(s): Secondary | ICD-10-CM

## 2022-07-02 NOTE — Progress Notes (Signed)
  Subjective:  Patient ID: Whitney Campbell, female    DOB: 08/23/1935,  MRN: [id number]  Chief Complaint  Patient presents with   Nail Problem    Routine Foot Care     87 y.o. female presents with the above complaint. History confirmed with patient. Patient presenting with pain related to dystrophic thickened elongated nails. Patient is unable to trim own nails related to nail dystrophy and/or mobility issues. Patient does not have a history of T2DM.   Objective:  Physical Exam: warm, good capillary refill nail exam onychomycosis of the toenails, onycholysis, and dystrophic nails DP pulses palpable, PT pulses palpable, and protective sensation intact Left Foot:  Pain with palpation of nails due to elongation and dystrophic growth.  Right Foot: Pain with palpation of nails due to elongation and dystrophic growth.   Assessment:   1. Pain due to onychomycosis of nail       Plan:  Patient was evaluated and treated and all questions answered.  #Onychomycosis with pain  -Nails palliatively debrided as below. -Educated on self-care  Procedure: Nail Debridement Rationale: Pain Type of Debridement: manual, sharp debridement. Instrumentation: Nail nipper, rotary burr. Number of Nails: 10  No follow-ups on file.         Everitt Amber, DPM [facility]

## 2022-07-06 DIAGNOSIS — M25511 Pain in right shoulder: Secondary | ICD-10-CM | POA: Diagnosis not present

## 2022-07-06 DIAGNOSIS — M25611 Stiffness of right shoulder, not elsewhere classified: Secondary | ICD-10-CM | POA: Diagnosis not present

## 2022-07-08 DIAGNOSIS — M25611 Stiffness of right shoulder, not elsewhere classified: Secondary | ICD-10-CM | POA: Diagnosis not present

## 2022-07-08 DIAGNOSIS — M25511 Pain in right shoulder: Secondary | ICD-10-CM | POA: Diagnosis not present

## 2022-07-13 DIAGNOSIS — M25511 Pain in right shoulder: Secondary | ICD-10-CM | POA: Diagnosis not present

## 2022-07-13 DIAGNOSIS — M25611 Stiffness of right shoulder, not elsewhere classified: Secondary | ICD-10-CM | POA: Diagnosis not present

## 2022-07-20 DIAGNOSIS — M7061 Trochanteric bursitis, right hip: Secondary | ICD-10-CM | POA: Diagnosis not present

## 2022-07-21 DIAGNOSIS — M7061 Trochanteric bursitis, right hip: Secondary | ICD-10-CM | POA: Diagnosis not present

## 2022-07-27 DIAGNOSIS — M19011 Primary osteoarthritis, right shoulder: Secondary | ICD-10-CM | POA: Diagnosis not present

## 2022-10-04 ENCOUNTER — Ambulatory Visit (INDEPENDENT_AMBULATORY_CARE_PROVIDER_SITE_OTHER): Payer: Medicare Other | Admitting: Podiatry

## 2022-10-04 DIAGNOSIS — M79676 Pain in unspecified toe(s): Secondary | ICD-10-CM | POA: Diagnosis not present

## 2022-10-04 DIAGNOSIS — B351 Tinea unguium: Secondary | ICD-10-CM | POA: Diagnosis not present

## 2022-10-04 NOTE — Progress Notes (Signed)
  Subjective:  Patient ID: Whitney Campbell, female    DOB: 06/07/1935,  MRN: [id number]  Chief Complaint  Patient presents with   Nail Problem    Routine Foot Care     87 y.o. female presents with the above complaint. History confirmed with patient. Patient presenting with pain related to dystrophic thickened elongated nails. Patient is unable to trim own nails related to nail dystrophy and/or mobility issues. Patient does not have a history of T2DM.   Objective:  Physical Exam: warm, good capillary refill nail exam onychomycosis of the toenails, onycholysis, and dystrophic nails DP pulses palpable, PT pulses palpable, and protective sensation intact Left Foot:  Pain with palpation of nails due to elongation and dystrophic growth.  Right Foot: Pain with palpation of nails due to elongation and dystrophic growth.   Assessment:   1. Pain due to onychomycosis of nail     Plan:  Patient was evaluated and treated and all questions answered.  #Onychomycosis with pain  -Nails palliatively debrided as below. -Educated on self-care  Procedure: Nail Debridement Rationale: Pain Type of Debridement: manual, sharp debridement. Instrumentation: Nail nipper, rotary burr. Number of Nails: 10  Return in about 3 months (around 01/04/2023) for RFC.         Corinna Gab, DPM [facility]

## 2022-12-09 DIAGNOSIS — Z170 Estrogen receptor positive status [ER+]: Secondary | ICD-10-CM | POA: Diagnosis not present

## 2022-12-09 DIAGNOSIS — D649 Anemia, unspecified: Secondary | ICD-10-CM | POA: Diagnosis not present

## 2022-12-09 DIAGNOSIS — C50812 Malignant neoplasm of overlapping sites of left female breast: Secondary | ICD-10-CM | POA: Diagnosis not present

## 2022-12-09 DIAGNOSIS — I10 Essential (primary) hypertension: Secondary | ICD-10-CM | POA: Diagnosis not present

## 2022-12-22 DIAGNOSIS — E559 Vitamin D deficiency, unspecified: Secondary | ICD-10-CM | POA: Diagnosis not present

## 2022-12-22 DIAGNOSIS — E785 Hyperlipidemia, unspecified: Secondary | ICD-10-CM | POA: Diagnosis not present

## 2022-12-22 DIAGNOSIS — Z0000 Encounter for general adult medical examination without abnormal findings: Secondary | ICD-10-CM | POA: Diagnosis not present

## 2022-12-22 DIAGNOSIS — M8580 Other specified disorders of bone density and structure, unspecified site: Secondary | ICD-10-CM | POA: Diagnosis not present

## 2022-12-22 DIAGNOSIS — E041 Nontoxic single thyroid nodule: Secondary | ICD-10-CM | POA: Diagnosis not present

## 2022-12-22 DIAGNOSIS — I10 Essential (primary) hypertension: Secondary | ICD-10-CM | POA: Diagnosis not present

## 2022-12-22 DIAGNOSIS — M1991 Primary osteoarthritis, unspecified site: Secondary | ICD-10-CM | POA: Diagnosis not present

## 2022-12-22 DIAGNOSIS — N3281 Overactive bladder: Secondary | ICD-10-CM | POA: Diagnosis not present

## 2022-12-22 DIAGNOSIS — Z79899 Other long term (current) drug therapy: Secondary | ICD-10-CM | POA: Diagnosis not present

## 2022-12-22 DIAGNOSIS — C50912 Malignant neoplasm of unspecified site of left female breast: Secondary | ICD-10-CM | POA: Diagnosis not present

## 2022-12-22 DIAGNOSIS — G43709 Chronic migraine without aura, not intractable, without status migrainosus: Secondary | ICD-10-CM | POA: Diagnosis not present

## 2022-12-22 DIAGNOSIS — Z780 Asymptomatic menopausal state: Secondary | ICD-10-CM | POA: Diagnosis not present

## 2023-01-04 ENCOUNTER — Ambulatory Visit (INDEPENDENT_AMBULATORY_CARE_PROVIDER_SITE_OTHER): Payer: Medicare Other | Admitting: Podiatry

## 2023-01-04 DIAGNOSIS — L600 Ingrowing nail: Secondary | ICD-10-CM | POA: Diagnosis not present

## 2023-01-04 DIAGNOSIS — B351 Tinea unguium: Secondary | ICD-10-CM

## 2023-01-04 DIAGNOSIS — M79609 Pain in unspecified limb: Secondary | ICD-10-CM | POA: Diagnosis not present

## 2023-01-04 NOTE — Progress Notes (Signed)
  Subjective:  Patient ID: Whitney Campbell, female    DOB: [birth year],  MRN: [id number]  Chief Complaint  Patient presents with   Nail Problem    Routine Foot Care-nail trim    Ingrown Toenail    Right hallux lateral border.     87 y.o. female presents with the above complaint. History confirmed with patient. Patient presenting with pain related to dystrophic thickened elongated nails. Patient is unable to trim own nails related to nail dystrophy and/or mobility issues. Patient does not have a history of T2DM.  Patient also has pain along the lateral border of the right great toenail.  Concern about possible ingrown.  Denies drainage.  Objective:  Physical Exam: warm, good capillary refill nail exam onychomycosis of the toenails, onycholysis, and dystrophic nails DP pulses palpable, PT pulses palpable, and protective sensation intact Left Foot:  Pain with palpation of nails due to elongation and dystrophic growth.  Right Foot: Pain with palpation of nails due to elongation and dystrophic growth. Incurvation is present along the lateral nail border of the right great toe. There is localized edema without any erythema or increase in warmth around the nail border. There is no drainage or pus. There is no ascending cellulitis. No malodor. No open lesions or pre-ulcerative lesions.    Assessment:   1. Ingrown nail of great toe of right foot   2. Pain due to onychomycosis of nail     Plan:  Patient was evaluated and treated and all questions answered.  Ingrown Nail, right -Patient elects to proceed with minor surgery to remove ingrown toenail today. Consent reviewed and signed by patient. -Ingrown nail excised. See procedure note. -Educated on post-procedure care including soaking. Written instructions provided and reviewed. -Patient to follow up in 2 weeks for nail check.  Procedure: Excision of Ingrown Toenail Location: Right 1st toe lateral nail borders. Anesthesia: Lidocaine 1%  plain; 1.5 mL and Marcaine 0.5% plain; 1.5 mL, digital block. Skin Prep: Betadine. Dressing: Silvadene; telfa; dry, sterile, compression dressing. Technique: Following skin prep, the toe was exsanguinated and a tourniquet was secured at the base of the toe. The affected nail border was freed, split with a nail splitter, and excised. Chemical matrixectomy was then performed with phenol and irrigated out with alcohol. The tourniquet was then removed and sterile dressing applied. Disposition: Patient tolerated procedure well. Patient to return in 2 weeks for follow-up.    #Onychomycosis with pain  -Nails palliatively debrided as below. -Educated on self-care  Procedure: Nail Debridement Rationale: Pain Type of Debridement: manual, sharp debridement. Instrumentation: Nail nipper, rotary burr. Number of Nails: 10  Return in about 3 months (around 04/05/2023) for RFC.         Corinna Gab, DPM [facility] / [facility]

## 2023-01-04 NOTE — Patient Instructions (Signed)

## 2023-01-24 ENCOUNTER — Ambulatory Visit (INDEPENDENT_AMBULATORY_CARE_PROVIDER_SITE_OTHER): Payer: Medicare Other | Admitting: Podiatry

## 2023-01-24 DIAGNOSIS — L600 Ingrowing nail: Secondary | ICD-10-CM

## 2023-01-24 NOTE — Progress Notes (Signed)
Subjective: KYMARI LOLLIS is a 87 y.o.  female returns to office today for follow up evaluation after having right Hallux lateral border nail ingrown removal with phenol and alcohol matrixectomy approximately 2 weeks ago. Patient has been soaking using epsom satls and applying topical antibiotic covered with bandaid daily. Patient denies fevers, chills, nausea, vomiting. Denies any calf pain, chest pain, SOB.   Objective:  Vitals: Reviewed  General: Well developed, nourished, in no acute distress, alert and oriented x3   Dermatology: Skin is warm, dry and supple bilateral. right hallux nail border appears to be clean, dry, with mild granular tissue and surrounding scab. There is no surrounding erythema, edema, drainage/purulence. The remaining nails appear unremarkable at this time. There are no other lesions or other signs of infection present.  Neurovascular status: Intact. No lower extremity swelling; No pain with calf compression bilateral.  Musculoskeletal: Decreased tenderness to palpation of the right hallux nail fold(s). Muscular strength within normal limits bilateral.   Assesement and Plan: S/p phenol and alcohol matrixectomy to the  right hallux nail lateral, doing well.   -Continue soaking in epsom salts twice a day followed by antibiotic ointment and a band-aid. Can leave uncovered at night. Continue this until completely healed.  -If the area has not healed in 2 weeks, call the office for follow-up appointment, or sooner if any problems arise.  -Monitor for any signs/symptoms of infection. Call the office immediately if any occur or go directly to the emergency room. Call with any questions/concerns.        Corinna Gab, DPM [facility] / [facility]                   01/24/2023

## 2023-02-10 DIAGNOSIS — N3000 Acute cystitis without hematuria: Secondary | ICD-10-CM | POA: Diagnosis not present

## 2023-03-22 DIAGNOSIS — M7061 Trochanteric bursitis, right hip: Secondary | ICD-10-CM | POA: Diagnosis not present

## 2023-04-14 DIAGNOSIS — E871 Hypo-osmolality and hyponatremia: Secondary | ICD-10-CM | POA: Diagnosis not present

## 2023-04-26 DIAGNOSIS — K529 Noninfective gastroenteritis and colitis, unspecified: Secondary | ICD-10-CM | POA: Diagnosis not present

## 2023-04-26 DIAGNOSIS — R4781 Slurred speech: Secondary | ICD-10-CM | POA: Diagnosis not present

## 2023-04-26 DIAGNOSIS — R4789 Other speech disturbances: Secondary | ICD-10-CM | POA: Diagnosis not present

## 2023-04-26 DIAGNOSIS — I959 Hypotension, unspecified: Secondary | ICD-10-CM | POA: Diagnosis not present

## 2023-04-26 DIAGNOSIS — R9431 Abnormal electrocardiogram [ECG] [EKG]: Secondary | ICD-10-CM | POA: Diagnosis not present

## 2023-04-26 DIAGNOSIS — E860 Dehydration: Secondary | ICD-10-CM | POA: Diagnosis not present

## 2023-04-26 DIAGNOSIS — R42 Dizziness and giddiness: Secondary | ICD-10-CM | POA: Diagnosis not present

## 2023-04-26 DIAGNOSIS — I510 Cardiac septal defect, acquired: Secondary | ICD-10-CM | POA: Diagnosis not present

## 2023-04-26 DIAGNOSIS — Z743 Need for continuous supervision: Secondary | ICD-10-CM | POA: Diagnosis not present

## 2023-04-26 DIAGNOSIS — I951 Orthostatic hypotension: Secondary | ICD-10-CM | POA: Diagnosis not present

## 2023-05-05 DIAGNOSIS — Z1231 Encounter for screening mammogram for malignant neoplasm of breast: Secondary | ICD-10-CM | POA: Diagnosis not present

## 2023-05-13 DIAGNOSIS — H26493 Other secondary cataract, bilateral: Secondary | ICD-10-CM | POA: Diagnosis not present

## 2023-05-13 DIAGNOSIS — Z961 Presence of intraocular lens: Secondary | ICD-10-CM | POA: Diagnosis not present

## 2023-05-13 DIAGNOSIS — H5203 Hypermetropia, bilateral: Secondary | ICD-10-CM | POA: Diagnosis not present

## 2023-05-13 DIAGNOSIS — H02831 Dermatochalasis of right upper eyelid: Secondary | ICD-10-CM | POA: Diagnosis not present

## 2023-05-13 DIAGNOSIS — H43811 Vitreous degeneration, right eye: Secondary | ICD-10-CM | POA: Diagnosis not present

## 2023-05-13 DIAGNOSIS — H02834 Dermatochalasis of left upper eyelid: Secondary | ICD-10-CM | POA: Diagnosis not present

## 2023-05-13 DIAGNOSIS — H524 Presbyopia: Secondary | ICD-10-CM | POA: Diagnosis not present

## 2023-05-13 DIAGNOSIS — H40003 Preglaucoma, unspecified, bilateral: Secondary | ICD-10-CM | POA: Diagnosis not present

## 2023-05-13 DIAGNOSIS — H52203 Unspecified astigmatism, bilateral: Secondary | ICD-10-CM | POA: Diagnosis not present

## 2023-05-13 DIAGNOSIS — H35363 Drusen (degenerative) of macula, bilateral: Secondary | ICD-10-CM | POA: Diagnosis not present

## 2023-05-13 DIAGNOSIS — H353131 Nonexudative age-related macular degeneration, bilateral, early dry stage: Secondary | ICD-10-CM | POA: Diagnosis not present

## 2023-06-21 DIAGNOSIS — Z23 Encounter for immunization: Secondary | ICD-10-CM | POA: Diagnosis not present

## 2023-06-21 DIAGNOSIS — N3281 Overactive bladder: Secondary | ICD-10-CM | POA: Diagnosis not present

## 2023-06-21 DIAGNOSIS — K582 Mixed irritable bowel syndrome: Secondary | ICD-10-CM | POA: Diagnosis not present

## 2023-06-22 DIAGNOSIS — C44619 Basal cell carcinoma of skin of left upper limb, including shoulder: Secondary | ICD-10-CM | POA: Diagnosis not present

## 2023-06-22 DIAGNOSIS — L570 Actinic keratosis: Secondary | ICD-10-CM | POA: Diagnosis not present

## 2023-06-22 DIAGNOSIS — L821 Other seborrheic keratosis: Secondary | ICD-10-CM | POA: Diagnosis not present

## 2023-06-22 DIAGNOSIS — D485 Neoplasm of uncertain behavior of skin: Secondary | ICD-10-CM | POA: Diagnosis not present

## 2023-06-22 DIAGNOSIS — C44519 Basal cell carcinoma of skin of other part of trunk: Secondary | ICD-10-CM | POA: Diagnosis not present

## 2023-06-29 NOTE — Progress Notes (Unsigned)
 06/30/2023 Whitney Campbell [id number] [birth year]  Referring provider: Street, Stephanie Coup, * Primary GI doctor: Dr. Adela Lank  ASSESSMENT AND PLAN:   Diarrhea with fecal incontinence Previous cholecystectomy, history of urinary incontinence s/p bladder and rectal surgery in her 60's Unknown last colonoscopy, was in [place] in 60's No recent imaging -Most suspicious for pelvic floor dysfunction with history of urinary continence as well and overflow diarrhea with days without a BM then diarrhea. -Negative FOBT on exam -Will get sed rate CRP Get KUB -Consider endoscopic evaluation if abnormal labs or change in symptoms, anemia -Consider pelvic floor physical therapy after evaluation of X-ray and labs.  Anal Fissure Large posterior fissure causing discomfort. -Prescribe compound medication with numbing and relaxing properties to promote healing. To be picked up from [facility]. Follow up 2-3 months   Patient Care Team: Street, Stephanie Coup, MD as PCP - General Katrinka Blazing, Karle Starch, MD (Internal Medicine)  HISTORY OF PRESENT ILLNESS: Discussed the use of AI scribe software for clinical note transcription with the patient, who gave verbal consent to proceed.  History of Present Illness   Whitney Campbell is an 88 year old female who presents with diarrhea and fecal incontinence. She is accompanied by her daughter.  She experiences extensive diarrhea that she cannot control, occurring every three to four days. Despite attempts to correlate it with dietary intake, she has not identified any specific triggers. This issue has been ongoing for a long time, intensifying recently. She recalls having similar issues since her sixties, despite undergoing rectal surgery at that time.  She experiences alternating constipation and diarrhea, with periods of constipation lasting about three days followed by explosive diarrhea. She describes her stools as sometimes being  'like goat balls' and carries a change of clothes due to the unpredictability of her bowel movements. She has not experienced hard stools in a long time.  In November, she experienced an episode of nausea and vomiting, followed by dizziness and dehydration, requiring hospitalization and IV fluids. No regular nausea or vomiting since then. No episodes of stomach distention or inability to pass gas, although she does experience gas occasionally.  She has a history of high-dose ibuprofen use for joint pain but has since discontinued it. No significant weight loss in the past six months to a year.  Her last colonoscopy was in her late sixties, and she is unsure of the exact location or physician involved.  No shortness of breath or chest pain. Her last bowel movement was early yesterday morning, described as 'like applesauce'. No abdominal pain. She has been using hemorrhoid cream for what she believed were hemorrhoids, but it has not been effective.        She  reports that she has never smoked. She has never used smokeless tobacco. She reports that she does not drink alcohol and does not use drugs.  RELEVANT GI HISTORY, IMAGING AND LABS: Results   LABS Hb: 11.2 g/dL (40/98/1191) MCV: 92 fL (12/08/2021) Iron: 66 g/dL (47/82/9562) Saturation: 21% (12/08/2021) Ferritin: 39 ng/mL (12/08/2021)      CBC    Component Value Date/Time   WBC 6.2 11/11/2015 0428   RBC 3.43 (L) 11/11/2015 0428   HGB 10.4 (L) 11/11/2015 0428   HCT 32.1 (L) 11/11/2015 0428   PLT 95 (L) 11/11/2015 0428   MCV 93.6 11/11/2015 0428   MCH 30.3 11/11/2015 0428   MCHC 32.4 11/11/2015 0428   RDW 14.8 11/11/2015 0428   LYMPHSABS 1.6 10/29/2015  1046   MONOABS 0.3 10/29/2015 1046   EOSABS 0.1 10/29/2015 1046   BASOSABS 0.0 10/29/2015 1046   No results for input(s): "HGB" in the last 8760 hours.  CMP     Component Value Date/Time   NA 138 11/11/2015 0428   K 4.5 11/11/2015 0428   CL 108 11/11/2015 0428   CO2  26 11/11/2015 0428   GLUCOSE 103 (H) 11/11/2015 0428   BUN 17 11/11/2015 0428   CREATININE 1.08 (H) 11/11/2015 0428   CALCIUM 9.6 11/11/2015 0428   PROT 6.4 (L) 10/29/2015 1046   ALBUMIN 3.3 (L) 10/29/2015 1046   AST 16 10/29/2015 1046   ALT 14 10/29/2015 1046   ALKPHOS 49 10/29/2015 1046   BILITOT 0.3 10/29/2015 1046   GFRNONAA 48 (L) 11/11/2015 0428   GFRAA 56 (L) 11/11/2015 0428      Latest Ref Rng & Units 10/29/2015   10:46 AM 07/30/2015    1:11 PM  Hepatic Function  Total Protein 6.5 - 8.1 g/dL 6.4  6.7   Albumin 3.5 - 5.0 g/dL 3.3  3.6   AST 15 - 41 U/L 16  20   ALT 14 - 54 U/L 14  15   Alk Phosphatase 38 - 126 U/L 49  47   Total Bilirubin 0.3 - 1.2 mg/dL 0.3  0.3   Bilirubin, Direct 0.1 - 0.5 mg/dL  0.1       Current Medications:    Current Outpatient Medications (Cardiovascular):    amLODipine (NORVASC) 10 MG tablet, Take 10 mg by mouth daily.   Current Outpatient Medications (Analgesics):    rizatriptan (MAXALT) 10 MG tablet, Take 10 mg by mouth as needed. May repeat in 2 hours if needed   meloxicam (MOBIC) 15 MG tablet, Take 1 tablet (15 mg total) by mouth daily. For 2 weeks for pain and inflammation. Then take as needed (Patient not taking: Reported on 06/30/2023)  Current Outpatient Medications (Hematological):    vitamin B-12 (CYANOCOBALAMIN) 1000 MCG tablet, Take 2,500 mcg by mouth daily.  (Patient not taking: Reported on 06/30/2023)  Current Outpatient Medications (Other):    AMBULATORY NON FORMULARY MEDICATION, Medication Name: Diltiazem 2%/Lidocaine 2%   Using your index finger apply a small amount of medication inside the anal opening and to the external anal area twice daily x 6 weeks.   Ascorbic Acid (VITAMIN C) 100 MG tablet, Take 100 mg by mouth daily.   Biotin 5000 MCG TABS, Take 5,000 mcg by mouth daily. Reported on 10/27/2015 (Patient not taking: Reported on 06/30/2023)   Cinnamon 500 MG capsule, Take 500 mg by mouth daily as needed (for supplementation).   (Patient not taking: Reported on 06/30/2023)   magnesium gluconate (MAGONATE) 500 MG tablet, Take 250 mg by mouth 2 (two) times daily. (Patient not taking: Reported on 06/30/2023)   vitamin E 400 UNIT capsule, Take 400 Units by mouth daily. (Patient not taking: Reported on 06/30/2023)   zinc gluconate 50 MG tablet, Take 50 mg by mouth daily. (Patient not taking: Reported on 06/30/2023)  Medical History:  Past Medical History:  Diagnosis Date   Arthritis    Breast cancer (HCC)    Cancer (HCC) 04/26/2010   breast left   DDD (degenerative disc disease), lumbar    Elevated cholesterol    GERD (gastroesophageal reflux disease)    hx in past   Headache    occ migraines   Hypertension    Migraine    Pneumothorax    congential  defect  x 3 surgery corrected in 1976   Sciatic pain    Stress bladder incontinence, female    Allergies:  Allergies  Allergen Reactions   Requip [Ropinirole Hcl] Other (See Comments)    Hallucinations at 4 mg strength   Morphine And Codeine Nausea And Vomiting     Surgical History:  She  has a past surgical history that includes Lung surgery; Back surgery; Cataract extraction; Dental surgery; plantar fascitis (Left); Biopsy thyroid; Breast surgery (Left, 2012); Abdominal hysterectomy (84); Video assisted thoracoscopy (vats) w/talc pleuadesis (Left, 78); Back surgery (2000); Eye surgery (Bilateral, 07); bladder tuck; Total knee arthroplasty (Left, 08/04/2015); Total knee arthroplasty (Right, 11/10/2015); and Reverse shoulder arthroplasty (Right, 04/15/2022). Family History:  Her family history includes Breast cancer in her maternal grandmother; Liver cancer in her sister; Lung cancer in her sister; Pancreatic cancer in her sister.  REVIEW OF SYSTEMS  : All other systems reviewed and negative except where noted in the History of Present Illness.  PHYSICAL EXAM: BP 124/70   Pulse 91   Ht 5\' 9"  (1.753 m)   Wt 171 lb (77.6 kg)   BMI 25.25 kg/m  Physical Exam   GENERAL  APPEARANCE: Well nourished, in no apparent distress. HEENT: No cervical lymphadenopathy, unremarkable thyroid, sclerae anicteric, conjunctiva pink. RESPIRATORY: Respiratory effort normal, breath sounds equal bilateral without rales, rhonchi, wheezing. CARDIO: Regular rate and rhythm with systolic murmur at right sternal border, peripheral pulses intact. ABDOMEN: Soft, non-distended, hypoactive bowel sounds, no tenderness to palpation, no rebound, no mass appreciated. RECTAL: Posterior anal fissure, no external hemorrhoids, stool hemoccult negative, no rectal masses, decreased rectal tone. MUSCULOSKELETAL: Full range of motion, normal gait, without edema. SKIN: Dry, intact without rashes or lesions. No jaundice. NEURO: Alert, oriented, no focal deficits. PSYCH: Cooperative, normal mood and affect.      Doree Albee, PA-C 11:44 AM

## 2023-06-30 ENCOUNTER — Ambulatory Visit
Admission: RE | Admit: 2023-06-30 | Discharge: 2023-06-30 | Disposition: A | Discharge status: Home / Self Care | Source: Ambulatory Visit | Attending: Physician Assistant | Admitting: Physician Assistant

## 2023-06-30 ENCOUNTER — Ambulatory Visit (INDEPENDENT_AMBULATORY_CARE_PROVIDER_SITE_OTHER): Payer: Medicare Other | Admitting: Physician Assistant

## 2023-06-30 ENCOUNTER — Encounter: Payer: Self-pay | Admitting: Physician Assistant

## 2023-06-30 ENCOUNTER — Other Ambulatory Visit

## 2023-06-30 VITALS — BP 124/70 | HR 91 | Ht 69.0 in | Wt 171.0 lb

## 2023-06-30 DIAGNOSIS — M6289 Other specified disorders of muscle: Secondary | ICD-10-CM

## 2023-06-30 DIAGNOSIS — R197 Diarrhea, unspecified: Secondary | ICD-10-CM

## 2023-06-30 DIAGNOSIS — K602 Anal fissure, unspecified: Secondary | ICD-10-CM | POA: Diagnosis not present

## 2023-06-30 DIAGNOSIS — K5902 Outlet dysfunction constipation: Secondary | ICD-10-CM

## 2023-06-30 DIAGNOSIS — R140 Abdominal distension (gaseous): Secondary | ICD-10-CM | POA: Diagnosis not present

## 2023-06-30 DIAGNOSIS — R159 Full incontinence of feces: Secondary | ICD-10-CM | POA: Diagnosis not present

## 2023-06-30 DIAGNOSIS — D649 Anemia, unspecified: Secondary | ICD-10-CM

## 2023-06-30 LAB — COMPREHENSIVE METABOLIC PANEL
ALT: 13 U/L (ref 0–35)
AST: 16 U/L (ref 0–37)
Albumin: 4.1 g/dL (ref 3.5–5.2)
Alkaline Phosphatase: 85 U/L (ref 39–117)
BUN: 27 mg/dL — ABNORMAL HIGH (ref 6–23)
CO2: 23 meq/L (ref 19–32)
Calcium: 10.2 mg/dL (ref 8.4–10.5)
Chloride: 101 meq/L (ref 96–112)
Creatinine, Ser: 1.06 mg/dL (ref 0.40–1.20)
GFR: 47.92 mL/min — ABNORMAL LOW (ref >60.00)
Glucose, Bld: 94 mg/dL (ref 70–99)
Potassium: 3.9 meq/L (ref 3.5–5.1)
Sodium: 133 meq/L — ABNORMAL LOW (ref 135–145)
Total Bilirubin: 0.4 mg/dL (ref 0.2–1.2)
Total Protein: 7.1 g/dL (ref 6.0–8.3)

## 2023-06-30 LAB — CBC WITH DIFFERENTIAL/PLATELET
Basophils Absolute: 0 10*3/uL (ref 0.0–0.1)
Basophils Relative: 0.8 % (ref 0.0–3.0)
Eosinophils Absolute: 0.1 10*3/uL (ref 0.0–0.7)
Eosinophils Relative: 1.9 % (ref 0.0–5.0)
HCT: 38.6 % (ref 36.0–46.0)
Hemoglobin: 12.7 g/dL (ref 12.0–15.0)
Lymphocytes Relative: 18 % (ref 12.0–46.0)
Lymphs Abs: 1.1 10*3/uL (ref 0.7–4.0)
MCHC: 33 g/dL (ref 30.0–36.0)
MCV: 94 fl (ref 78.0–100.0)
Monocytes Absolute: 0.6 10*3/uL (ref 0.1–1.0)
Monocytes Relative: 9.5 % (ref 3.0–12.0)
Neutro Abs: 4.1 10*3/uL (ref 1.4–7.7)
Neutrophils Relative %: 69.8 % (ref 43.0–77.0)
Platelets: 225 10*3/uL (ref 150.0–400.0)
RBC: 4.1 Mil/uL (ref 3.87–5.11)
RDW: 13.8 % (ref 11.5–15.5)
WBC: 5.8 10*3/uL (ref 4.0–10.5)

## 2023-06-30 LAB — IBC + FERRITIN
Ferritin: 42.3 ng/mL (ref 10.0–291.0)
Iron: 74 ug/dL (ref 42–145)
Saturation Ratios: 21.8 % (ref 20.0–50.0)
TIBC: 340.2 ug/dL (ref 250.0–450.0)
Transferrin: 243 mg/dL (ref 212.0–360.0)

## 2023-06-30 LAB — TSH: TSH: 0.4 u[IU]/mL (ref 0.35–5.50)

## 2023-06-30 LAB — SEDIMENTATION RATE: Sed Rate: 62 mm/h — ABNORMAL HIGH (ref 0–30)

## 2023-06-30 MED ORDER — AMBULATORY NON FORMULARY MEDICATION: 1 refills | Status: DC

## 2023-06-30 NOTE — Progress Notes (Signed)
 Agree with assessment and plan as outlined.

## 2023-06-30 NOTE — Patient Instructions (Signed)
 Your provider has requested that you go to the basement level for lab work before leaving today. Press "B" on the elevator. The lab is located at the first door on the left as you exit the elevator.  Your provider has requested that you have an abdominal x ray before leaving today. Please go to the basement floor to our Radiology department for the test.   Anal Fissure, Adult  A fissure is a linear defect in the anal mucosa, symptoms include burning, itching, discomfort especially with a bowel movement with associated rectal bleeding.  Risk factors include low fiber diet, chronic constipation and straining. Anal fissures can take a very long time to heal so this will be a 2 to 46-month process.  Treatment for a fissure includes:  -decreasing time in the toilet should not be more than 5 minutes -adding fiber supplement such as Benefiber or Citrucel -increasing water. -I am also going to send in a calcium channel blocker cream to a compound pharmacy, apply twice daily for 12 weeks. If after 3 months this is not helpful then we will we will refer you to general surgery for evaluation, they can do Botox injections under anesthesia or surgery.  Diltiazem/lidocaine 3 x daily for 2 months sent to compound pharmacy   Sent this medication to a compound pharmacy:  [facility] [address] [place], [place], [place] [address]  [phone]  Please DO NOT go directly from our office to pick up this medication! Give the pharmacy 1 day to process the prescription. Extra time is required for them to compound your medication.  I believe your loose stools may be due to something called overflow diarrhea. If you predominantly have constipation, straining or incomplete bowel movements at times the only thing to get through the large amounts of stool is liquid. I describe it like rocks in a tube, if you pour water into the tube, only water will come out with occ rocks(stools).  This can cause someone  to feel like they are having diarrhea yet actually they have too much backup of stool. We can evaluate this with a rectal exam, and x-ray of your abdomen, or CT scan. If this is the case usually doing a combination of Benefiber 1-2 times daily with MiraLAX 17 g daily can be helpful. At times there are medications that can also be helpful for this but generally I start with over-the-counter I also like somebody to get a squatty potty to help straighten out the colon whether having a bowel movement. Sometimes we will schedule for pelvic floor physical therapy if you are having incomplete bowel movements and failure pelvic floor is weak and contributing to the constipation.  Benefiber or Citracel is good for constipation/diarrhea/irritable bowel syndrome, it helps with weight loss and can help lower your bad cholesterol. Please do 1 TBSP in the morning in water, coffee, or tea up to twice a day. It can take up to a month before you can see a difference with your bowel movements. It is cheapest from [employer], [employer], [employer].   Miralax is an osmotic laxative.  It only brings more water into the stool.  This is safe to take daily.  Can take up to 17 gram of miralax twice a day.  Mix with juice or coffee.  Start 1 capful at night for 3-4 days and reassess your response in 3-4 days.  You can increase and decrease the dose based on your response.  Remember, it can take up to  3-4 days to take effect OR for the effects to wear off.   I often pair this with benefiber in the morning to help assure the stool is not too loose.   Here some information about pelvic floor dysfunction. This may be contributing to some of your symptoms. We will continue with our evaluation but I do want you to consider adding on fiber supplement with low-dose MiraLAX daily. We could also refer to pelvic floor physical therapy.   Pelvic Floor Dysfunction, Female Pelvic floor dysfunction (PFD) is a condition that results when the  group of muscles and connective tissues that support the organs in the pelvis (pelvic floor muscles) do not work well. These muscles and their connections form a sling that supports the colon and bladder. In women, they also support the uterus. PFD causes pelvic floor muscles to be too weak, too tight, or both. In PFD, muscle movements are not coordinated. This may cause bowel or bladder problems. It may also cause pain. What are the causes? This condition may be caused by an injury to the pelvic area or by a weakening of pelvic muscles. This often results from pregnancy and childbirth or other types of strain. In many cases, the exact cause is not known. What increases the risk? The following factors may make you more likely to develop this condition: Having chronic bladder tissue inflammation (interstitial cystitis). Being an older person. Being overweight. History of radiation treatment for cancer in the pelvic region. Previous pelvic surgery, such as removal of the uterus (hysterectomy). What are the signs or symptoms? Symptoms of this condition vary and may include: Bladder symptoms, such as: Trouble starting urination and emptying the bladder. Frequent urinary tract infections. Leaking urine when coughing, laughing, or exercising (stress incontinence). Having to pass urine urgently or frequently. Pain when passing urine. Bowel symptoms, such as: Constipation. Urgent or frequent bowel movements. Incomplete bowel movements. Painful bowel movements. Leaking stool or gas. Unexplained genital or rectal pain. Genital or rectal muscle spasms. Low back pain. Other symptoms may include: A heavy, full, or aching feeling in the vagina. A bulge that protrudes into the vagina. Pain during or after sex. How is this diagnosed? This condition may be diagnosed based on: Your symptoms and medical history. A physical exam. During the exam, your health care provider may check your pelvic muscles  for tightness, spasm, pain, or weakness. This may include a rectal exam and a pelvic exam. In some cases, you may have diagnostic tests, such as: Electrical muscle function tests. Urine flow testing. X-ray tests of bowel function. Ultrasound of the pelvic organs. How is this treated? Treatment for this condition depends on the symptoms. Treatment options include: Physical therapy. This may include Kegel exercises to help relax or strengthen the pelvic floor muscles. Biofeedback. This type of therapy provides feedback on how tight your pelvic floor muscles are so that you can learn to control them. Internal or external massage therapy. A treatment that involves electrical stimulation of the pelvic floor muscles to help control pain (transcutaneous electrical nerve stimulation, or TENS). Sound wave therapy (ultrasound) to reduce muscle spasms. Medicines, such as: Muscle relaxants. Bladder control medicines. Surgery to reconstruct or support pelvic floor muscles may be an option if other treatments do not help. Follow these instructions at home: Activity Do your usual activities as told by your health care provider. Ask your health care provider if you should modify any activities. Do pelvic floor strengthening or relaxing exercises at home as told by  your physical therapist. Lifestyle Maintain a healthy weight. Eat foods that are high in fiber, such as beans, whole grains, and fresh fruits and vegetables. Limit foods that are high in fat and processed sugars, such as fried or sweet foods. Manage stress with relaxation techniques such as yoga or meditation. General instructions If you have problems with leakage: Use absorbable pads or wear padded underwear. Wash frequently with mild soap. Keep your genital and anal area as clean and dry as possible. Ask your health care provider if you should try a barrier cream to prevent skin irritation. Take warm baths to relieve pelvic muscle tension  or spasms. Take over-the-counter and prescription medicines only as told by your health care provider. Keep all follow-up visits. How is this prevented? The cause of PFD is not always known, but there are a few things you can do to reduce the risk of developing this condition, including: Staying at a healthy weight. Getting regular exercise. Managing stress. Contact a health care provider if: Your symptoms are not improving with home care. You have signs or symptoms of PFD that get worse at home. You develop new signs or symptoms. You have signs of a urinary tract infection, such as: Fever. Chills. Increased urinary frequency. A burning feeling when urinating. You have not had a bowel movement in 3 days (constipation). Summary Pelvic floor dysfunction results when the muscles and connective tissues in your pelvic floor do not work well. These muscles and their connections form a sling that supports your colon and bladder. In women, they also support the uterus. PFD may be caused by an injury to the pelvic area or by a weakening of pelvic muscles. PFD causes pelvic floor muscles to be too weak, too tight, or a combination of both. Symptoms may vary from person to person. In most cases, PFD can be treated with physical therapies and medicines. Surgery may be an option if other treatments do not help. This information is not intended to replace advice given to you by your health care provider. Make sure you discuss any questions you have with your health care provider. Document Revised: 08/20/2020 Document Reviewed: 08/20/2020 Elsevier Patient Education  2022 [employer].

## 2023-07-06 ENCOUNTER — Telehealth: Payer: Self-pay | Admitting: Physician Assistant

## 2023-07-06 ENCOUNTER — Encounter: Payer: Self-pay | Admitting: *Deleted

## 2023-07-06 NOTE — Telephone Encounter (Signed)
 Its now 1:15 pm and I have missed patient being at home. She is My Chart Active I will send her a My Chart message about her labs,

## 2023-07-06 NOTE — Telephone Encounter (Signed)
 Marchelle Folks I called patient about her lab results and she was wondering about her Xray.  Thanks

## 2023-07-06 NOTE — Telephone Encounter (Signed)
 Patient called stating that she is at home and is requesting a call to discuss. Please advise.

## 2023-07-06 NOTE — Telephone Encounter (Signed)
 Noted, I told patient she will be able to see it once on My Chart and it results. Her daughter is going to help her with using My Chart

## 2023-07-06 NOTE — Telephone Encounter (Signed)
 Patient returning call in regards to MyChart message about results. States she will be home until noon.   Please advise. Thank you

## 2023-07-22 DIAGNOSIS — C44519 Basal cell carcinoma of skin of other part of trunk: Secondary | ICD-10-CM | POA: Diagnosis not present

## 2023-07-28 ENCOUNTER — Ambulatory Visit: Payer: Medicare Other | Admitting: Gastroenterology

## 2023-08-05 DIAGNOSIS — C44619 Basal cell carcinoma of skin of left upper limb, including shoulder: Secondary | ICD-10-CM | POA: Diagnosis not present

## 2023-08-11 DIAGNOSIS — M7062 Trochanteric bursitis, left hip: Secondary | ICD-10-CM | POA: Diagnosis not present

## 2023-08-11 DIAGNOSIS — M7061 Trochanteric bursitis, right hip: Secondary | ICD-10-CM | POA: Diagnosis not present

## 2023-09-05 DIAGNOSIS — I4891 Unspecified atrial fibrillation: Secondary | ICD-10-CM | POA: Diagnosis not present

## 2023-09-05 DIAGNOSIS — J329 Chronic sinusitis, unspecified: Secondary | ICD-10-CM | POA: Diagnosis not present

## 2023-09-05 DIAGNOSIS — J40 Bronchitis, not specified as acute or chronic: Secondary | ICD-10-CM | POA: Diagnosis not present

## 2023-09-06 ENCOUNTER — Other Ambulatory Visit: Payer: Self-pay

## 2023-09-06 DIAGNOSIS — E7800 Pure hypercholesterolemia, unspecified: Secondary | ICD-10-CM | POA: Insufficient documentation

## 2023-09-06 DIAGNOSIS — I10 Essential (primary) hypertension: Secondary | ICD-10-CM | POA: Insufficient documentation

## 2023-09-06 DIAGNOSIS — M5430 Sciatica, unspecified side: Secondary | ICD-10-CM | POA: Insufficient documentation

## 2023-09-06 DIAGNOSIS — J939 Pneumothorax, unspecified: Secondary | ICD-10-CM | POA: Insufficient documentation

## 2023-09-06 DIAGNOSIS — M51369 Other intervertebral disc degeneration, lumbar region without mention of lumbar back pain or lower extremity pain: Secondary | ICD-10-CM | POA: Insufficient documentation

## 2023-09-06 DIAGNOSIS — N393 Stress incontinence (female) (male): Secondary | ICD-10-CM | POA: Insufficient documentation

## 2023-09-06 DIAGNOSIS — I4819 Other persistent atrial fibrillation: Secondary | ICD-10-CM | POA: Insufficient documentation

## 2023-09-06 DIAGNOSIS — K219 Gastro-esophageal reflux disease without esophagitis: Secondary | ICD-10-CM | POA: Insufficient documentation

## 2023-09-06 DIAGNOSIS — R519 Headache, unspecified: Secondary | ICD-10-CM | POA: Insufficient documentation

## 2023-09-06 DIAGNOSIS — I4891 Unspecified atrial fibrillation: Secondary | ICD-10-CM | POA: Insufficient documentation

## 2023-09-06 DIAGNOSIS — Z6826 Body mass index (BMI) 26.0-26.9, adult: Secondary | ICD-10-CM | POA: Insufficient documentation

## 2023-09-06 DIAGNOSIS — G43909 Migraine, unspecified, not intractable, without status migrainosus: Secondary | ICD-10-CM | POA: Insufficient documentation

## 2023-09-06 DIAGNOSIS — C50919 Malignant neoplasm of unspecified site of unspecified female breast: Secondary | ICD-10-CM | POA: Insufficient documentation

## 2023-09-06 DIAGNOSIS — M1990 Unspecified osteoarthritis, unspecified site: Secondary | ICD-10-CM | POA: Insufficient documentation

## 2023-09-15 ENCOUNTER — Ambulatory Visit

## 2023-09-20 ENCOUNTER — Ambulatory Visit: Admitting: Gastroenterology

## 2023-09-26 ENCOUNTER — Encounter: Payer: Self-pay | Admitting: Gastroenterology

## 2023-09-26 ENCOUNTER — Ambulatory Visit (INDEPENDENT_AMBULATORY_CARE_PROVIDER_SITE_OTHER): Admitting: Gastroenterology

## 2023-09-26 VITALS — BP 122/68 | HR 88 | Ht 69.0 in | Wt 170.0 lb

## 2023-09-26 DIAGNOSIS — K5900 Constipation, unspecified: Secondary | ICD-10-CM

## 2023-09-26 DIAGNOSIS — R159 Full incontinence of feces: Secondary | ICD-10-CM | POA: Diagnosis not present

## 2023-09-26 DIAGNOSIS — K602 Anal fissure, unspecified: Secondary | ICD-10-CM

## 2023-09-26 DIAGNOSIS — R197 Diarrhea, unspecified: Secondary | ICD-10-CM | POA: Diagnosis not present

## 2023-09-26 MED ORDER — AMBULATORY NON FORMULARY MEDICATION: 0 refills | Status: DC

## 2023-09-26 NOTE — Patient Instructions (Addendum)
 Your provider has prescribed NTG gel for you. Please follow the directions written on your prescription bottle or given to you specifically by your provider. Since this is a specialty medication and is not readily available at most local pharmacies, we have sent your prescription to:  [facility] information is below: Address: [address] [place], [place] [address]  [phone]  *Please DO NOT go directly from our office to pick up this medication! Give the pharmacy 1 day to process the prescription as this is compounded and takes time to make.   NTG 0.125% use gloved hand, apply pea size amount every 6-8 hours for pain rectally, #30 gram no refills.    _______________________________________________________  If your blood pressure at your visit was 140/90 or greater, please contact your primary care physician to follow up on this.  _______________________________________________________  If you are age 14 or older, your body mass index should be between 23-30. Your Body mass index is 25.1 kg/m. If this is out of the aforementioned range listed, please consider follow up with your Primary Care Provider.  If you are age 4 or younger, your body mass index should be between 19-25. Your Body mass index is 25.1 kg/m. If this is out of the aformentioned range listed, please consider follow up with your Primary Care Provider.   ________________________________________________________  The [facility] providers would like to encourage you to use MYCHART to communicate with providers for non-urgent requests or questions.  Due to long hold times on the telephone, sending your provider a message by [facility] may be a faster and more efficient way to get a response.  Please allow 48 business hours for a response.  Please remember that this is for non-urgent requests.  _______________________________________________________

## 2023-09-26 NOTE — Progress Notes (Signed)
 Chief Complaint: follow up Primary GI MD: Dr. General Kenner  HPI: Discussed the use of AI scribe software for clinical note transcription with the patient, who gave verbal consent to proceed.  History of Present Illness Whitney Campbell is an 88 year old female who presents with diarrhea and fecal incontinence.  She experiences diarrhea and fecal incontinence with fluctuating symptoms, requiring urgent access to a bathroom to prevent accidents. She uses MiraLAX and Benefiber as part of her treatment regimen. Initially, she took one tablespoon of MiraLAX and two tablespoons of Benefiber daily, with an additional tablespoon of Benefiber at lunch. This regimen has resulted in stools that are 'somewhat solid, but soft solid', necessitating quick access to a bathroom to avoid accidents.  She does not have a bowel movement every day, sometimes skipping up to two days. However, when she does have a bowel movement, it is soft and formed, and she does not need to strain. She describes the stool as 'constipated in shape, but with water or liquid that flushes it out easily'.  She discusses a previous anal fissure, identified during a rectal exam, for which she was prescribed a compound cream used for about three to four weeks. She reports no current pain or bleeding. She had to drive back to pick up a second dose of the cream due to concerns about urinary frequency potentially washing away the medication. She is frustrated with the cost of the compound cream and the fact that her insurance did not cover it.  She mentions frequent urination, especially at night, which she believes may have affected the efficacy of the cream. No current pain or bleeding from the fissure.   Past Medical History:  Diagnosis Date   Acid reflux 12/08/2013   Adaptive colitis 12/08/2013   Adiposity 12/08/2013   Allergic rhinitis 12/08/2013   Arm swelling 12/08/2013   Arthralgia of hip or thigh 12/08/2013   Arthritis     Arthritis, degenerative 12/08/2013   Atrial fibrillation (HCC)    Avitaminosis D 12/08/2013   Blood pressure elevated without history of HTN 12/08/2013   BMI 26.0-26.9,adult    BP (high blood pressure) 12/08/2013   Breast cancer (HCC)    Breast lump 12/08/2013   Bursitis 12/08/2013   Calcaneal spur 12/08/2013   Calcium blood increased 12/08/2013   Cancer (HCC) 04/26/2010   breast left   Cephalalgia 12/08/2013   Chronic right shoulder pain 03/04/2016   Clinical depression 12/08/2013   Closed fracture of distal end of radius 12/08/2013   Contusion of shoulder 12/08/2013   DDD (degenerative disc disease), lumbar    Difficulty hearing 12/08/2013   Disturbance in sleep behavior 12/08/2013   Dizziness 12/08/2013   Ecchymosis 12/08/2013   Elevated cholesterol    Elevated fasting blood sugar 12/08/2013   Feeling bilious 12/08/2013   Female genuine stress incontinence 12/08/2013   GERD (gastroesophageal reflux disease)    hx in past   Goiter, nontoxic, multinodular 12/08/2013   Headache    occ migraines   Headache, migraine 12/08/2013   HLD (hyperlipidemia) 12/08/2013   Hypertension    Intrinsic sphincter deficiency 12/08/2013   Long-term current use of tamoxifen  03/07/2017   Low back pain 12/08/2013   IMO SNOMED Dx Update Oct 2024     Malignant neoplasm of female breast (HCC) 12/08/2013   Malignant neoplasm of overlapping sites of left breast in female, estrogen receptor positive (HCC) 03/07/2017   Menopausal symptom 12/08/2013   Migraine    OP (osteoporosis) 12/08/2013  Pain in the wrist 12/08/2013   Pneumothorax    congential  defect x 3 surgery corrected in 1976   S/P arthroscopy of shoulder 05/25/2016   S/P total knee replacement 11/10/2015   Sciatic pain    Stress bladder incontinence, female    Thoracic and lumbosacral neuritis 12/08/2013   Thyroid  nodule 05/11/2017   Use of tamoxifen  (Nolvadex ) 02/19/2016    Past Surgical History:  Procedure Laterality Date    ABDOMINAL HYSTERECTOMY  82   BACK SURGERY     BACK SURGERY  2000   BIOPSY THYROID      bladder tuck     BREAST SURGERY Left 2012    lumpectomy   CATARACT EXTRACTION     bilateral   DENTAL SURGERY     implants   EYE SURGERY Bilateral 07   cataracts   LUNG SURGERY     1976   plantar fascitis Left    REVERSE SHOULDER ARTHROPLASTY Right 04/15/2022   Procedure: REVERSE SHOULDER ARTHROPLASTY;  Surgeon: Micheline Ahr, MD;  Location: [facility];  Service: Orthopedics;  Laterality: Right;   TOTAL KNEE ARTHROPLASTY Left 08/04/2015   Procedure: LEFT TOTAL KNEE ARTHROPLASTY;  Surgeon: Christie Cox, MD;  Location: [facility];  Service: Orthopedics;  Laterality: Left;   TOTAL KNEE ARTHROPLASTY Right 11/10/2015   Procedure: RIGHT TOTAL KNEE ARTHROPLASTY;  Surgeon: Christie Cox, MD;  Location: [facility];  Service: Orthopedics;  Laterality: Right;   VIDEO ASSISTED THORACOSCOPY (VATS) W/TALC  PLEUADESIS Left 78   collapsed lung  patched    Current Outpatient Medications  Medication Sig Dispense Refill   amLODipine (NORVASC) 10 MG tablet Take 10 mg by mouth daily.     Ascorbic Acid (VITAMIN C) 1000 MG tablet Take 1,000 mg by mouth daily.     Biotin 5000 MCG TABS Take 5,000 mcg by mouth daily. Reported on 10/27/2015     desmopressin (DDAVP) 0.2 MG tablet Take 200 mcg by mouth daily.     ELIQUIS 5 MG TABS tablet Take 5 mg by mouth 2 (two) times daily.     Multiple Vitamins-Minerals (PRESERVISION AREDS 2 PO) Take 1 tablet by mouth daily.     rizatriptan (MAXALT) 10 MG tablet Take 10 mg by mouth as needed for migraine. May repeat in 2 hours if needed     vitamin B-12 (CYANOCOBALAMIN) 1000 MCG tablet Take 2,500 mcg by mouth daily.     VITAMIN D PO Take 1 tablet by mouth daily.     vitamin E 400 UNIT capsule Take 400 Units by mouth daily.     zinc gluconate 50 MG tablet Take 50 mg by mouth daily.     AMBULATORY NON FORMULARY MEDICATION Medication Name: Diltiazem 2%/Lidocaine  2%   Using your index  finger apply a small amount of medication inside the anal opening and to the external anal area twice daily x 6 weeks. (Patient not taking: Reported on 09/26/2023) 30 g 1   No current facility-administered medications for this visit.    Allergies as of 09/26/2023 - Review Complete 09/26/2023  Allergen Reaction Noted   Requip  [ropinirole  hcl] Other (See Comments) 10/27/2015   Morphine and codeine Nausea And Vomiting 07/25/2015    Family History  Problem Relation Age of Onset   Pancreatic cancer Sister    Liver cancer Sister    Lung cancer Sister    Breast cancer Maternal Grandmother    Colon cancer Neg Hx    Esophageal cancer Neg Hx    Stomach cancer  Neg Hx     Social History   Socioeconomic History   Marital status: Divorced    Spouse name: Not on file   Number of children: 4   Years of education: Not on file   Highest education level: Not on file  Occupational History   Occupation: retired  Tobacco Use   Smoking status: Never   Smokeless tobacco: Never  Vaping Use   Vaping status: Never Used  Substance and Sexual Activity   Alcohol use: No   Drug use: No   Sexual activity: Not on file  Other Topics Concern   Not on file  Social History Narrative   ** Merged History Encounter **       Social Drivers of Corporate investment banker Strain: Not on file  Food Insecurity: Not on file  Transportation Needs: Not on file  Physical Activity: Not on file  Stress: Not on file  Social Connections: Not on file  Intimate Partner Violence: Not on file    Review of Systems:    Constitutional: No weight loss, fever, chills, weakness or fatigue HEENT: Eyes: No change in vision               Ears, Nose, Throat:  No change in hearing or congestion Skin: No rash or itching Cardiovascular: No chest pain, chest pressure or palpitations   Respiratory: No SOB or cough Gastrointestinal: See HPI and otherwise negative Genitourinary: No dysuria or change in urinary  frequency Neurological: No headache, dizziness or syncope Musculoskeletal: No new muscle or joint pain Hematologic: No bleeding or bruising Psychiatric: No history of depression or anxiety    Physical Exam:  Vital signs: BP 122/68   Pulse 88   Ht 5\' 9"  (1.753 m)   Wt 170 lb (77.1 kg)   BMI 25.10 kg/m   Constitutional: NAD, alert and cooperative Head:  Normocephalic and atraumatic. Eyes:   PEERL, EOMI. No icterus. Conjunctiva pink. Respiratory: Respirations even and unlabored. Lungs clear to auscultation bilaterally.   No wheezes, crackles, or rhonchi.  Cardiovascular:  Regular rate and rhythm. No peripheral edema, cyanosis or pallor.  Gastrointestinal:  Soft, nondistended, nontender. No rebound or guarding. Normal bowel sounds. No appreciable masses or hepatomegaly. Rectal: small external hemorrhoid. Small 1mm posterior anal fissure Msk:  Symmetrical without gross deformities. Without edema, no deformity or joint abnormality.  Neurologic:  Alert and  oriented x4;  grossly normal neurologically.  Skin:   Dry and intact without significant lesions or rashes. Psychiatric: Oriented to person, place and time. Demonstrates good judgement and reason without abnormal affect or behaviors.   RELEVANT LABS AND IMAGING: CBC    Component Value Date/Time   WBC 5.8 06/30/2023 1117   RBC 4.10 06/30/2023 1117   HGB 12.7 06/30/2023 1117   HCT 38.6 06/30/2023 1117   PLT 225.0 06/30/2023 1117   MCV 94.0 06/30/2023 1117   MCH 30.3 11/11/2015 0428   MCHC 33.0 06/30/2023 1117   RDW 13.8 06/30/2023 1117   LYMPHSABS 1.1 06/30/2023 1117   MONOABS 0.6 06/30/2023 1117   EOSABS 0.1 06/30/2023 1117   BASOSABS 0.0 06/30/2023 1117    CMP     Component Value Date/Time   NA 133 (L) 06/30/2023 1117   K 3.9 06/30/2023 1117   CL 101 06/30/2023 1117   CO2 23 06/30/2023 1117   GLUCOSE 94 06/30/2023 1117   BUN 27 (H) 06/30/2023 1117   CREATININE 1.06 06/30/2023 1117   CALCIUM 10.2 06/30/2023 1117  PROT 7.1 06/30/2023 1117   ALBUMIN 4.1 06/30/2023 1117   AST 16 06/30/2023 1117   ALT 13 06/30/2023 1117   ALKPHOS 85 06/30/2023 1117   BILITOT 0.4 06/30/2023 1117   GFRNONAA 48 (L) 11/11/2015 0428   GFRAA 56 (L) 11/11/2015 0428     Assessment/Plan:   Diarrhea with fecal incontinence Suspected pelvis floor dysfunction Previous cholecystectomy, history of urinary incontinence s/p bladder and rectal surgery in her 60's Unknown last colonoscopy, was in [place] in 60's KUB with stool burden. CBC, CMP unrevealing.  Improvement with benefiber 2 tablespoons per day and miralax 1 tablespoon per day now having soft formed Bms without straining -- continue benefiber and miralax -- can adjust dosing and decrease miralax as needed -- follow up 6 months  Anal Fissure Noted at last appt and given compound cream with improvement in discomfort. Minor fissure noted on rectal exam today. - NTG 1.25% use gloved hand, apply pea size amount every 6-8 hours for pain rectally, #30 gram no refills.    Gigi Kyle [facility] 09/26/2023, 9:43 AM  Cc: Street, Renford Cartwright, *

## 2023-09-27 DIAGNOSIS — H40003 Preglaucoma, unspecified, bilateral: Secondary | ICD-10-CM | POA: Diagnosis not present

## 2023-09-27 NOTE — Progress Notes (Signed)
 Agree with assessment and plan as outlined.  I would also consider pelvic floor PT if she has not tried that already.

## 2023-09-29 DIAGNOSIS — I4891 Unspecified atrial fibrillation: Secondary | ICD-10-CM | POA: Diagnosis not present

## 2023-09-29 DIAGNOSIS — R053 Chronic cough: Secondary | ICD-10-CM | POA: Diagnosis not present

## 2023-09-29 DIAGNOSIS — R946 Abnormal results of thyroid function studies: Secondary | ICD-10-CM | POA: Diagnosis not present

## 2023-09-29 DIAGNOSIS — I509 Heart failure, unspecified: Secondary | ICD-10-CM | POA: Diagnosis not present

## 2023-10-10 ENCOUNTER — Ambulatory Visit: Admitting: Cardiology

## 2023-10-10 DIAGNOSIS — H40003 Preglaucoma, unspecified, bilateral: Secondary | ICD-10-CM | POA: Diagnosis not present

## 2023-10-10 DIAGNOSIS — H52203 Unspecified astigmatism, bilateral: Secondary | ICD-10-CM | POA: Diagnosis not present

## 2023-10-10 DIAGNOSIS — H5203 Hypermetropia, bilateral: Secondary | ICD-10-CM | POA: Diagnosis not present

## 2023-10-10 DIAGNOSIS — H524 Presbyopia: Secondary | ICD-10-CM | POA: Diagnosis not present

## 2023-10-11 ENCOUNTER — Ambulatory Visit

## 2023-10-11 VITALS — BP 130/84 | HR 95 | Ht 69.0 in | Wt 169.6 lb

## 2023-10-11 DIAGNOSIS — I509 Heart failure, unspecified: Secondary | ICD-10-CM

## 2023-10-11 DIAGNOSIS — I4891 Unspecified atrial fibrillation: Secondary | ICD-10-CM

## 2023-10-11 DIAGNOSIS — I5032 Chronic diastolic (congestive) heart failure: Secondary | ICD-10-CM

## 2023-10-11 HISTORY — DX: Heart failure, unspecified: I50.9

## 2023-10-11 NOTE — Progress Notes (Signed)
 Cardiology Consultation:    Date:  10/11/2023   ID:  PARISA PINELA, DOB 1936-03-22, MRN [id number]  PCP:  Street, Renford Cartwright, MD  Cardiologist:  Daymon Evans Kierrah Kilbride, MD   Referring MD: Street, Renford Cartwright, *   No chief complaint on file.    ASSESSMENT AND PLAN:   Ms. Gebel 88 year old woman with new diagnosis of atrial fibrillation May 2025 asymptomatic, CKD stage III, dyslipidemia, remote history of breast cancer, left leg injury and surgeries, here for evaluation of atrial fibrillation.   Problem List Items Addressed This Visit     Atrial fibrillation [facility]) - Primary   New diagnosis at PCP office visit May 2025. Remains in atrial fibrillation today here at our office visit. Asymptomatic. Rates controlled. CHADS2 Vasc score 4. Continue anticoagulation Eliquis 5 mg twice daily.  Discussed about the diagnosis of atrial fibrillation and potential triggers.  Potential long-term issues with elevated heart rates resulting in cardiomyopathy and heart failure discussed. Potential stroke risk discussed.  Stroke prophylaxis with anticoagulation versus left atrial appendage occluder versus atrial clipping devices reviewed. Tolerating Eliquis well, continue with the same 5 mg twice daily dose. [Creatinine 1.06 and EGFR 47 in March 2025, weight greater than 60 kg].  Will obtain Zio patch for 14 days to assess if A-fib is persistent or paroxysmal and for rate control assessment.  Will obtain transthoracic echocardiogram to assess for any significant underlying cardiomyopathy.       Relevant Medications   furosemide (LASIX) 20 MG tablet   Other Relevant Orders   EKG 12-Lead (Completed)   ECHOCARDIOGRAM COMPLETE   LONG TERM MONITOR (3-14 DAYS)   CHF (congestive heart failure) (HCC)   Elevated BNP, bilateral pitting pedal edema.  Improved with Lasix 20 mg twice daily.  Will obtain an LV function assessment with transthoracic echocardiogram.  Advised low-sodium diet  less than 2 g/day Advised regular weight monitoring at home.  Continue with furosemide 20 mg twice daily as initiated by PCP.  Will request recent blood work results pertaining to her renal function and electrolytes   will review echocardiogram results and determine if any further changes need to be made to her medication regimen      Relevant Medications   furosemide (LASIX) 20 MG tablet   Return to clinic in 3 months   History of Present Illness:    MALIAKA BRASINGTON is a 88 y.o. female who is being seen today for the evaluation of atrial fibrillation at the request of Street, Renford Cartwright, *. Pleasant woman here for the visit today accompanied by her 2 daughters. She lives by herself at home and manages her day-to-day activities.  History of atrial fibrillation newly diagnosed May 2025, CKD stage III, dyslipidemia,, remote history of breast cancer, left leg injury and surgeries. Denies any prior history of CAD, MI, CVA.  Recently at PCPs office visit May 12 was noted with irregular heart rhythm on physical exam and EKG noted atrial fibrillation.  After that she was started on Eliquis 5 mg twice daily tolerating it well. Heart rates have been well-controlled.  Denies any awareness of irregular heartbeat or palpitations. Denies any syncopal or near syncopal episodes.  Earlier on New Year's Eve in January felt she was having mild altered mentation and in the ER was given IV fluids with resolution of symptoms.  No symptoms of chest pain, shortness of breath, orthopnea, paroxysmal nocturnal dyspnea. Does have mild bilateral lower extremity edema left worse than right associated with her prior left  leg injury and surgeries. In the context of recently elevated BNP levels was started on Lasix 20 mg twice daily by PCP and has noted significant improvement in swelling.  EKG in the clinic today shows atrial fibrillation heart rate 95/min, QRS duration 132 ms consistent with nonspecific  intraventricular conduction delay   Recent blood work from 6/08-2023 at PCPs office with BNP 494. Magnesium 2.  Recent blood work to review is from 06/30/2023 CBC with hemoglobin 12.7 and hematocrit 38.6, platelets 225 and WBC 5.8. CMP sodium 133, potassium 3.9, BUN 27 and creatinine 1.06 EGFR 47  Lipid panel with total cholesterol 224, LDL 133, HDL 68, triglycerides 115 on December 02, 2022. Hemoglobin A1c 5.3 on December 22, 2022  Past Medical History:  Diagnosis Date   Acid reflux 12/08/2013   Adaptive colitis 12/08/2013   Adiposity 12/08/2013   Allergic rhinitis 12/08/2013   Arm swelling 12/08/2013   Arthralgia of hip or thigh 12/08/2013   Arthritis    Arthritis, degenerative 12/08/2013   Atrial fibrillation (HCC)    Avitaminosis D 12/08/2013   Blood pressure elevated without history of HTN 12/08/2013   BMI 26.0-26.9,adult    BP (high blood pressure) 12/08/2013   Breast cancer (HCC)    Breast lump 12/08/2013   Bursitis 12/08/2013   Calcaneal spur 12/08/2013   Calcium blood increased 12/08/2013   Cancer (HCC) 04/26/2010   breast left   Cephalalgia 12/08/2013   Chronic right shoulder pain 03/04/2016   Clinical depression 12/08/2013   Closed fracture of distal end of radius 12/08/2013   Contusion of shoulder 12/08/2013   DDD (degenerative disc disease), lumbar    Difficulty hearing 12/08/2013   Disturbance in sleep behavior 12/08/2013   Dizziness 12/08/2013   Ecchymosis 12/08/2013   Elevated cholesterol    Elevated fasting blood sugar 12/08/2013   Feeling bilious 12/08/2013   Female genuine stress incontinence 12/08/2013   GERD (gastroesophageal reflux disease)    hx in past   Goiter, nontoxic, multinodular 12/08/2013   Headache    occ migraines   Headache, migraine 12/08/2013   HLD (hyperlipidemia) 12/08/2013   Hypertension    Intrinsic sphincter deficiency 12/08/2013   Long-term current use of tamoxifen  03/07/2017   Low back pain 12/08/2013   IMO SNOMED Dx  Update Oct 2024     Malignant neoplasm of female breast (HCC) 12/08/2013   Malignant neoplasm of overlapping sites of left breast in female, estrogen receptor positive (HCC) 03/07/2017   Menopausal symptom 12/08/2013   Migraine    OP (osteoporosis) 12/08/2013   Pain in the wrist 12/08/2013   Pneumothorax    congential  defect x 3 surgery corrected in 1976   S/P arthroscopy of shoulder 05/25/2016   S/P total knee replacement 11/10/2015   Sciatic pain    Stress bladder incontinence, female    Thoracic and lumbosacral neuritis 12/08/2013   Thyroid  nodule 05/11/2017   Use of tamoxifen  (Nolvadex ) 02/19/2016    Past Surgical History:  Procedure Laterality Date   ABDOMINAL HYSTERECTOMY  82   BACK SURGERY     BACK SURGERY  2000   BIOPSY THYROID      bladder tuck     BREAST SURGERY Left 2012    lumpectomy   CATARACT EXTRACTION     bilateral   DENTAL SURGERY     implants   EYE SURGERY Bilateral 07   cataracts   LUNG SURGERY     1976   plantar fascitis Left    REVERSE SHOULDER ARTHROPLASTY Right  04/15/2022   Procedure: REVERSE SHOULDER ARTHROPLASTY;  Surgeon: Micheline Ahr, MD;  Location: [facility];  Service: Orthopedics;  Laterality: Right;   TOTAL KNEE ARTHROPLASTY Left 08/04/2015   Procedure: LEFT TOTAL KNEE ARTHROPLASTY;  Surgeon: Christie Cox, MD;  Location: [facility];  Service: Orthopedics;  Laterality: Left;   TOTAL KNEE ARTHROPLASTY Right 11/10/2015   Procedure: RIGHT TOTAL KNEE ARTHROPLASTY;  Surgeon: Christie Cox, MD;  Location: [facility];  Service: Orthopedics;  Laterality: Right;   VIDEO ASSISTED THORACOSCOPY (VATS) W/TALC  PLEUADESIS Left 76   collapsed lung  patched    Current Medications: Current Meds  Medication Sig   amLODipine (NORVASC) 10 MG tablet Take 10 mg by mouth daily.   Ascorbic Acid (VITAMIN C) 1000 MG tablet Take 1,000 mg by mouth daily.   desmopressin (DDAVP) 0.2 MG tablet Take 200 mcg by mouth daily.   ELIQUIS 5 MG TABS tablet Take 5 mg by  mouth 2 (two) times daily.   furosemide (LASIX) 20 MG tablet Take 20 mg by mouth 2 (two) times daily.   Multiple Vitamins-Minerals (PRESERVISION AREDS 2 PO) Take 1 tablet by mouth daily.   vitamin B-12 (CYANOCOBALAMIN) 1000 MCG tablet Take 2,500 mcg by mouth daily.   VITAMIN D PO Take 1 tablet by mouth daily.   Wheat Dextrin (BENEFIBER PO) Take 1 tablet by mouth daily.     Allergies:   Requip  [ropinirole  hcl] and Morphine and codeine   Social History   Socioeconomic History   Marital status: Divorced    Spouse name: Not on file   Number of children: 4   Years of education: Not on file   Highest education level: Not on file  Occupational History   Occupation: retired  Tobacco Use   Smoking status: Never   Smokeless tobacco: Never  Vaping Use   Vaping status: Never Used  Substance and Sexual Activity   Alcohol use: No   Drug use: No   Sexual activity: Not on file  Other Topics Concern   Not on file  Social History Narrative   ** Merged History Encounter **       Social Drivers of Corporate investment banker Strain: Not on file  Food Insecurity: Not on file  Transportation Needs: Not on file  Physical Activity: Not on file  Stress: Not on file  Social Connections: Not on file     Family History: The patient's family history includes Breast cancer in her maternal grandmother; Liver cancer in her sister; Lung cancer in her sister; Pancreatic cancer in her sister. There is no history of Colon cancer, Esophageal cancer, or Stomach cancer. ROS:   Please see the history of present illness.    All 14 point review of systems negative except as described per history of present illness.  EKGs/Labs/Other Studies Reviewed:    The following studies were reviewed today:   EKG:  EKG Interpretation Date/Time:  Tuesday October 11 2023 10:17:59 EDT Ventricular Rate:  95 PR Interval:    QRS Duration:  132 QT Interval:  364 QTC Calculation: 457 R Axis:   -58  Text  Interpretation: Atrial fibrillation Left axis deviation Non-specific intra-ventricular conduction block Cannot rule out Anteroseptal infarct (cited on or before 13-Apr-2022) Abnormal ECG When compared with ECG of 13-Apr-2022 10:30, Atrial fibrillation has replaced Sinus rhythm Questionable change in QRS duration Confirmed by Bertha Broad reddy [id number]) on 10/11/2023 10:34:37 AM    Recent Labs: 06/30/2023: ALT 13; BUN 27; Creatinine, Ser 1.06; Hemoglobin  12.7; Platelets 225.0; Potassium 3.9; Sodium 133; TSH 0.40  Recent Lipid Panel No results found for: CHOL, TRIG, HDL, CHOLHDL, VLDL, LDLCALC, LDLDIRECT  Physical Exam:    VS:  BP 130/84   Pulse 95   Ht 5' 9 (1.753 m)   Wt 169 lb 9.6 oz (76.9 kg)   SpO2 98%   BMI 25.05 kg/m     Wt Readings from Last 3 Encounters:  10/11/23 169 lb 9.6 oz (76.9 kg)  09/26/23 170 lb (77.1 kg)  06/30/23 171 lb (77.6 kg)     GENERAL:  Well nourished, well developed in no acute distress NECK: No JVD; No carotid bruits CARDIAC: Irregularly irregular, S1 and S2 present, no murmurs, no rubs, no gallops CHEST:  Clear to auscultation without rales, wheezing or rhonchi  Extremities: Trace bilateral ankle edema left slightly worse than right.  Pulses bilaterally symmetric with radial 2+ and dorsalis pedis 2+ NEUROLOGIC:  Alert and oriented x 3  Medication Adjustments/Labs and Tests Ordered: Current medicines are reviewed at length with the patient today.  Concerns regarding medicines are outlined above.  Orders Placed This Encounter  Procedures   LONG TERM MONITOR (3-14 DAYS)   EKG 12-Lead   ECHOCARDIOGRAM COMPLETE   No orders of the defined types were placed in this encounter.   Signed, Allye Hoyos reddy Avraj Lindroth, MD, MPH, [facility]. 10/11/2023 11:05 AM    [facility]

## 2023-10-11 NOTE — Assessment & Plan Note (Signed)
 New diagnosis at PCP office visit May 2025. Remains in atrial fibrillation today here at our office visit. Asymptomatic. Rates controlled. CHADS2 Vasc score 4. Continue anticoagulation Eliquis 5 mg twice daily.  Discussed about the diagnosis of atrial fibrillation and potential triggers.  Potential long-term issues with elevated heart rates resulting in cardiomyopathy and heart failure discussed. Potential stroke risk discussed.  Stroke prophylaxis with anticoagulation versus left atrial appendage occluder versus atrial clipping devices reviewed. Tolerating Eliquis well, continue with the same 5 mg twice daily dose. [Creatinine 1.06 and EGFR 47 in March 2025, weight greater than 60 kg].  Will obtain Zio patch for 14 days to assess if A-fib is persistent or paroxysmal and for rate control assessment.  Will obtain transthoracic echocardiogram to assess for any significant underlying cardiomyopathy.

## 2023-10-11 NOTE — Assessment & Plan Note (Signed)
 Elevated BNP, bilateral pitting pedal edema.  Improved with Lasix 20 mg twice daily.  Will obtain an LV function assessment with transthoracic echocardiogram.  Advised low-sodium diet less than 2 g/day Advised regular weight monitoring at home.  Continue with furosemide 20 mg twice daily as initiated by PCP.  Will request recent blood work results pertaining to her renal function and electrolytes   will review echocardiogram results and determine if any further changes need to be made to her medication regimen

## 2023-10-11 NOTE — Patient Instructions (Signed)
 Medication Instructions:  Your physician recommends that you continue on your current medications as directed. Please refer to the Current Medication list given to you today.  *If you need a refill on your cardiac medications before your next appointment, please call your pharmacy*   Lab Work: None ordered If you have labs (blood work) drawn today and your tests are completely normal, you will receive your results only by: MyChart Message (if you have MyChart) OR A paper copy in the mail If you have any lab test that is abnormal or we need to change your treatment, we will call you to review the results.  Testing/Procedures: Your physician has requested that you have an echocardiogram. Echocardiography is a painless test that uses sound waves to create images of your heart. It provides your doctor with information about the size and shape of your heart and how well your heart's chambers and valves are working. This procedure takes approximately one hour. There are no restrictions for this procedure. Please do NOT wear cologne, perfume, aftershave, or lotions (deodorant is allowed). Please arrive 15 minutes prior to your appointment time.  Please note: We ask at that you not bring children with you during ultrasound (echo/ vascular) testing. Due to room size and safety concerns, children are not allowed in the ultrasound rooms during exams. Our front office staff cannot provide observation of children in our lobby area while testing is being conducted. An adult accompanying a patient to their appointment will only be allowed in the ultrasound room at the discretion of the ultrasound technician under special circumstances. We apologize for any inconvenience.  A zio monitor was ordered today. It will remain on for 14 days. Remove 10/25/23. You will then return monitor and event diary in provided box. It takes 1-2 weeks for report to be downloaded and returned to us . We will call you with the results.  If monitor falls off or has orange flashing light, please call Zio for further instructions.   Follow-Up: At [facility], you and your health needs are our priority.  As part of our continuing mission to provide you with exceptional heart care, we have created designated Provider Care Teams.  These Care Teams include your primary Cardiologist (physician) and Advanced Practice Providers (APPs -  Physician Assistants and Nurse Practitioners) who all work together to provide you with the care you need, when you need it.  We recommend signing up for the patient portal called MyChart.  Sign up information is provided on this After Visit Summary.  MyChart is used to connect with patients for Virtual Visits (Telemedicine).  Patients are able to view lab/test results, encounter notes, upcoming appointments, etc.  Non-urgent messages can be sent to your provider as well.   To learn more about what you can do with MyChart, go to [email or web address].    Your next appointment:   3 month(s)  The format for your next appointment:   In Person  Provider:   Tereasa Felty Madireddy, MD   Other Instructions Echocardiogram An echocardiogram is a test that uses sound waves (ultrasound) to produce images of the heart. Images from an echocardiogram can provide important information about: Heart size and shape. The size and thickness and movement of your heart's walls. Heart muscle function and strength. Heart valve function or if you have stenosis. Stenosis is when the heart valves are too narrow. If blood is flowing backward through the heart valves (regurgitation). A tumor or infectious growth around the heart valves. Areas of heart  muscle that are not working well because of poor blood flow or injury from a heart attack. Aneurysm detection. An aneurysm is a weak or damaged part of an artery wall. The wall bulges out from the normal force of blood pumping through the body. Tell a health care provider  about: Any allergies you have. All medicines you are taking, including vitamins, herbs, eye drops, creams, and over-the-counter medicines. Any blood disorders you have. Any surgeries you have had. Any medical conditions you have. Whether you are pregnant or may be pregnant. What are the risks? Generally, this is a safe test. However, problems may occur, including an allergic reaction to dye (contrast) that may be used during the test. What happens before the test? No specific preparation is needed. You may eat and drink normally. What happens during the test? You will take off your clothes from the waist up and put on a hospital gown. Electrodes or electrocardiogram (ECG)patches may be placed on your chest. The electrodes or patches are then connected to a device that monitors your heart rate and rhythm. You will lie down on a table for an ultrasound exam. A gel will be applied to your chest to help sound waves pass through your skin. A handheld device, called a transducer, will be pressed against your chest and moved over your heart. The transducer produces sound waves that travel to your heart and bounce back (or echo back) to the transducer. These sound waves will be captured in real-time and changed into images of your heart that can be viewed on a video monitor. The images will be recorded on a computer and reviewed by your health care provider. You may be asked to change positions or hold your breath for a short time. This makes it easier to get different views or better views of your heart. In some cases, you may receive contrast through an IV in one of your veins. This can improve the quality of the pictures from your heart. The procedure may vary among health care providers and hospitals.   What can I expect after the test? You may return to your normal, everyday life, including diet, activities, and medicines, unless your health care provider tells you not to do that. Follow these  instructions at home: It is up to you to get the results of your test. Ask your health care provider, or the department that is doing the test, when your results will be ready. Keep all follow-up visits. This is important. Summary An echocardiogram is a test that uses sound waves (ultrasound) to produce images of the heart. Images from an echocardiogram can provide important information about the size and shape of your heart, heart muscle function, heart valve function, and other possible heart problems. You do not need to do anything to prepare before this test. You may eat and drink normally. After the echocardiogram is completed, you may return to your normal, everyday life, unless your health care provider tells you not to do that. This information is not intended to replace advice given to you by your health care provider. Make sure you discuss any questions you have with your health care provider. Document Revised: 12/04/2019 Document Reviewed: 12/04/2019 Elsevier Patient Education  2021 Elsevier Inc.   Important Information About Sugar

## 2023-10-12 DIAGNOSIS — I5032 Chronic diastolic (congestive) heart failure: Secondary | ICD-10-CM | POA: Diagnosis not present

## 2023-10-12 DIAGNOSIS — I10 Essential (primary) hypertension: Secondary | ICD-10-CM | POA: Diagnosis not present

## 2023-10-12 DIAGNOSIS — I4891 Unspecified atrial fibrillation: Secondary | ICD-10-CM | POA: Diagnosis not present

## 2023-10-12 DIAGNOSIS — D6869 Other thrombophilia: Secondary | ICD-10-CM | POA: Diagnosis not present

## 2023-10-12 DIAGNOSIS — C50912 Malignant neoplasm of unspecified site of left female breast: Secondary | ICD-10-CM | POA: Diagnosis not present

## 2023-10-31 ENCOUNTER — Ambulatory Visit

## 2023-10-31 DIAGNOSIS — I4891 Unspecified atrial fibrillation: Secondary | ICD-10-CM | POA: Diagnosis not present

## 2023-10-31 LAB — ECHOCARDIOGRAM COMPLETE
AR max vel: 2.35 cm2
AV Area VTI: 2.37 cm2
AV Area mean vel: 2.3 cm2
AV Mean grad: 2 mmHg
AV Peak grad: 4.7 mmHg
Ao pk vel: 1.09 m/s
Area-P 1/2: 2.96 cm2
MV M vel: 4.59 m/s
MV Peak grad: 84.3 mmHg
MV VTI: 1.26 cm2
MV Vena cont: 0.6 cm
S' Lateral: 2.4 cm

## 2023-11-01 ENCOUNTER — Ambulatory Visit: Payer: Self-pay

## 2023-11-04 DIAGNOSIS — I4891 Unspecified atrial fibrillation: Secondary | ICD-10-CM | POA: Diagnosis not present

## 2023-11-14 DIAGNOSIS — T148XXA Other injury of unspecified body region, initial encounter: Secondary | ICD-10-CM | POA: Diagnosis not present

## 2023-11-14 DIAGNOSIS — S81812A Laceration without foreign body, left lower leg, initial encounter: Secondary | ICD-10-CM | POA: Diagnosis not present

## 2023-12-08 DIAGNOSIS — C50812 Malignant neoplasm of overlapping sites of left female breast: Secondary | ICD-10-CM | POA: Diagnosis not present

## 2023-12-08 DIAGNOSIS — Z170 Estrogen receptor positive status [ER+]: Secondary | ICD-10-CM | POA: Diagnosis not present

## 2023-12-08 DIAGNOSIS — D649 Anemia, unspecified: Secondary | ICD-10-CM | POA: Diagnosis not present

## 2023-12-30 ENCOUNTER — Ambulatory Visit

## 2023-12-30 VITALS — BP 118/62 | HR 80 | Ht 69.0 in | Wt 166.6 lb

## 2023-12-30 DIAGNOSIS — I10 Essential (primary) hypertension: Secondary | ICD-10-CM

## 2023-12-30 DIAGNOSIS — I4891 Unspecified atrial fibrillation: Secondary | ICD-10-CM

## 2023-12-30 DIAGNOSIS — I4819 Other persistent atrial fibrillation: Secondary | ICD-10-CM | POA: Diagnosis not present

## 2023-12-30 DIAGNOSIS — I5032 Chronic diastolic (congestive) heart failure: Secondary | ICD-10-CM

## 2023-12-30 MED ORDER — AMLODIPINE BESYLATE 5 MG PO TABS: 5.0000 mg | ORAL_TABLET | Freq: Every day | ORAL | 3 refills | Status: AC

## 2023-12-30 NOTE — Patient Instructions (Signed)
 Medication Instructions:  Your physician has recommended you make the following change in your medication:   Decrease your Amlodipine  to 5 mg daily.  *If you need a refill on your cardiac medications before your next appointment, please call your pharmacy*   Lab Work: None ordered If you have labs (blood work) drawn today and your tests are completely normal, you will receive your results only by: MyChart Message (if you have MyChart) OR A paper copy in the mail If you have any lab test that is abnormal or we need to change your treatment, we will call you to review the results.   Testing/Procedures: None ordered   Follow-Up: At [facility], you and your health needs are our priority.  As part of our continuing mission to provide you with exceptional heart care, we have created designated Provider Care Teams.  These Care Teams include your primary Cardiologist (physician) and Advanced Practice Providers (APPs -  Physician Assistants and Nurse Practitioners) who all work together to provide you with the care you need, when you need it.  We recommend signing up for the patient portal called MyChart.  Sign up information is provided on this After Visit Summary.  MyChart is used to connect with patients for Virtual Visits (Telemedicine).  Patients are able to view lab/test results, encounter notes, upcoming appointments, etc.  Non-urgent messages can be sent to your provider as well.   To learn more about what you can do with MyChart, go to [email or web address].    Your next appointment:   6 month(s)  The format for your next appointment:   In Person  Provider:   Alean Kobus, MD    Other Instructions none  Important Information About Sugar

## 2023-12-30 NOTE — Assessment & Plan Note (Signed)
 [place] suggestive of diastolic CHF. Well-controlled on current dose of Lasix 20 mg twice daily. Continue with low-salt diet less than 2 g/day.  Given asymptomatic state other than pedal edema and current cardiac function, hold off on further escalation of therapy at this time.

## 2023-12-30 NOTE — Assessment & Plan Note (Signed)
 Well-controlled blood pressure. Given her elderly age and softer blood pressures lower the amlodipine  dose to 5 mg once daily.

## 2023-12-30 NOTE — Assessment & Plan Note (Signed)
 Asymptomatic persistent atrial fibrillation. Diagnosed May 2025. 100% A-fib burden on Zio patch 14-day study from June 2025.  Asymptomatic and no high-grade AV blocks or pauses.  Average heart rate 90/min  CHA2DS2-VASc score 4. Continuing Eliquis 5 mg twice daily. Tolerating well. Mild cut over the left lower extremity as she bumped into a sharp object.  Healing well.  Continue with rate control strategy. Rhythm control options with cardioversion and antiarrhythmics reviewed but given her overall age, well preserved biventricular function on echocardiogram, asymptomatic nature and rates well-controlled, decided to continue with rate control strategy at this time. If she were to have any symptoms of palpitations, lightheadedness we will consider rhythm control options.

## 2023-12-30 NOTE — Progress Notes (Signed)
 Cardiology Consultation:    Date:  12/30/2023   ID:  Whitney Campbell, DOB Sep 05, 1935, MRN [id number]  PCP:  Street, Whitney HERO, MD  Cardiologist:  Alean SAUNDERS Jaysha Lasure, MD   Referring MD: Street, Whitney Campbell, *   No chief complaint on file.    ASSESSMENT AND PLAN:   [name] 88 year old woman with persistent atrial fibrillation [diagnosed May 2025], CKD stage III, dyslipidemia, remote history of breast cancer, prior left leg injury and surgeries, bilateral lower extremity dependent edema with elevated BNP with symptoms improved on low-dose Lasix. Here for follow-up. Echocardiogram 10/31/2023 with normal biventricular function, noted mild to moderate MR and mild TR, Zio patch 14 days study noted predominantly A-fib burden, rare less than 1% ventricular ectopy, no symptoms reported through the heart monitor.  Problem List Items Addressed This Visit     Persistent atrial fibrillation (HCC) - Primary   Asymptomatic persistent atrial fibrillation. Diagnosed May 2025. 100% A-fib burden on Zio patch 14-day study from June 2025.  Asymptomatic and no high-grade AV blocks or pauses.  Average heart rate 90/min  CHA2DS2-VASc score 4. Continuing Eliquis 5 mg twice daily. Tolerating well. Mild cut over the left lower extremity as she bumped into a sharp object.  Healing well.  Continue with rate control strategy. Rhythm control options with cardioversion and antiarrhythmics reviewed but given her overall age, well preserved biventricular function on echocardiogram, asymptomatic nature and rates well-controlled, decided to continue with rate control strategy at this time. If she were to have any symptoms of palpitations, lightheadedness we will consider rhythm control options.         Relevant Medications   amLODipine  (NORVASC ) 5 MG tablet   Hypertension   Well-controlled blood pressure. Given her elderly age and softer blood pressures lower the amlodipine  dose to 5 mg once  daily.       Relevant Medications   amLODipine  (NORVASC ) 5 MG tablet   CHF (congestive heart failure) (HCC)   [place] suggestive of diastolic CHF. Well-controlled on current dose of Lasix 20 mg twice daily. Continue with low-salt diet less than 2 g/day.  Given asymptomatic state other than pedal edema and current cardiac function, hold off on further escalation of therapy at this time.      Relevant Medications   amLODipine  (NORVASC ) 5 MG tablet   Return to clinic in 6 months.   History of Present Illness:    Whitney Campbell is a 88 y.o. female who is being seen today for follow-up visit. PCP is Street, [place], *. Last visit with me in the office was 10/11/2023.  She lives by herself at home and manages her day-to-day activities.  Here for the visit accompanied by her daughter.  History of atrial fibrillation diagnosed May 2025, CKD stage III, dyslipidemia,, remote history of breast cancer, left leg injury and surgeries. Denies any prior history of CAD, MI, CVA. Was started empirically on low-dose Lasix furosemide 20 mg once daily by PCP in the setting of bilateral lower extremity edema which was mild and elevated BNP levels [494 on 09/29/2023 at PCPs office].  Transthoracic echocardiogram 10/31/2023 noted normal biventricular function LVEF 60 to 65%, diastolic function indeterminate in the setting of atrial fibrillation, mild to moderate MR, mild TR.  Zio patch 14 days study from 10/11/2023 noted atrial fibrillation throughout the study with average heart rate 90/min [ranging from 50 to 166 bpm. Rare ventricular ectopy burden less than 1%.  No patient triggered events or diary entries.  No  high-grade AV blocks or pauses.  Recent blood work from 12/08/2023 noted WBC 4.6, hemoglobin 12.6, hematocrit 38.1, platelets 167.  Mentions overall she has been doing well.  Remains asymptomatic and does not have any symptoms of palpitations, lightheadedness.  Denies any syncopal or near  syncopal episodes. No chest pain or shortness of breath. Bilateral lower extremity edema still present worse towards the end of the day. Symptoms of pedal edema well-controlled on current dose of furosemide 20 mg twice daily dose.  Mentions blood pressures at home typically well-controlled.  Feels blood pressures today 118/62 mmHg or lower than usual. Currently on amlodipine  10 mg once daily at home.  Past Medical History:  Diagnosis Date   Acid reflux 12/08/2013   Adaptive colitis 12/08/2013   Adiposity 12/08/2013   Allergic rhinitis 12/08/2013   Arm swelling 12/08/2013   Arthralgia of hip or thigh 12/08/2013   Arthritis    Arthritis, degenerative 12/08/2013   Atrial fibrillation (HCC)    Avitaminosis D 12/08/2013   Blood pressure elevated without history of HTN 12/08/2013   BMI 26.0-26.9,adult    BP (high blood pressure) 12/08/2013   Breast cancer (HCC)    Breast lump 12/08/2013   Bursitis 12/08/2013   Calcaneal spur 12/08/2013   Calcium blood increased 12/08/2013   Cancer (HCC) 04/26/2010   breast left   Cephalalgia 12/08/2013   CHF (congestive heart failure) (HCC) 10/11/2023   Chronic right shoulder pain 03/04/2016   Clinical depression 12/08/2013   Closed fracture of distal end of radius 12/08/2013   Contusion of shoulder 12/08/2013   DDD (degenerative disc disease), lumbar    Difficulty hearing 12/08/2013   Disturbance in sleep behavior 12/08/2013   Dizziness 12/08/2013   Ecchymosis 12/08/2013   Elevated cholesterol    Elevated fasting blood sugar 12/08/2013   Feeling bilious 12/08/2013   Female genuine stress incontinence 12/08/2013   GERD (gastroesophageal reflux disease)    hx in past   Goiter, nontoxic, multinodular 12/08/2013   Headache    occ migraines   Headache, migraine 12/08/2013   HLD (hyperlipidemia) 12/08/2013   Hypertension    Intrinsic sphincter deficiency 12/08/2013   Long-term current use of tamoxifen  03/07/2017   Low back pain 12/08/2013    IMO SNOMED Dx Update Oct 2024     Malignant neoplasm of female breast (HCC) 12/08/2013   Malignant neoplasm of overlapping sites of left breast in female, estrogen receptor positive (HCC) 03/07/2017   Menopausal symptom 12/08/2013   Migraine    OP (osteoporosis) 12/08/2013   Pain in the wrist 12/08/2013   Pneumothorax    congential  defect x 3 surgery corrected in 1976   S/P arthroscopy of shoulder 05/25/2016   S/P total knee replacement 11/10/2015   Sciatic pain    Stress bladder incontinence, female    Thoracic and lumbosacral neuritis 12/08/2013   Thyroid  nodule 05/11/2017   Use of tamoxifen  (Nolvadex ) 02/19/2016    Past Surgical History:  Procedure Laterality Date   ABDOMINAL HYSTERECTOMY  82   BACK SURGERY     BACK SURGERY  2000   BIOPSY THYROID      bladder tuck     BREAST SURGERY Left 2012    lumpectomy   CATARACT EXTRACTION     bilateral   DENTAL SURGERY     implants   EYE SURGERY Bilateral 07   cataracts   LUNG SURGERY     1976   plantar fascitis Left    REVERSE SHOULDER ARTHROPLASTY Right 04/15/2022   Procedure:  REVERSE SHOULDER ARTHROPLASTY;  Surgeon: Cristy Bonner DASEN, MD;  Location: [facility];  Service: Orthopedics;  Laterality: Right;   TOTAL KNEE ARTHROPLASTY Left 08/04/2015   Procedure: LEFT TOTAL KNEE ARTHROPLASTY;  Surgeon: Marcey Raman, MD;  Location: [facility] OR;  Service: Orthopedics;  Laterality: Left;   TOTAL KNEE ARTHROPLASTY Right 11/10/2015   Procedure: RIGHT TOTAL KNEE ARTHROPLASTY;  Surgeon: Marcey Raman, MD;  Location: [facility] OR;  Service: Orthopedics;  Laterality: Right;   VIDEO ASSISTED THORACOSCOPY (VATS) W/TALC  PLEUADESIS Left 78   collapsed lung  patched    Current Medications: Current Meds  Medication Sig   Ascorbic Acid (VITAMIN C) 1000 MG tablet Take 1,000 mg by mouth daily.   desmopressin (DDAVP) 0.2 MG tablet Take 200 mcg by mouth daily.   ELIQUIS 5 MG TABS tablet Take 5 mg by mouth 2 (two) times daily.   furosemide (LASIX) 20  MG tablet Take 20 mg by mouth 2 (two) times daily.   Multiple Vitamins-Minerals (PRESERVISION AREDS 2 PO) Take 1 tablet by mouth daily.   Potassium Chloride ER 20 MEQ TBCR Take 1 tablet by mouth daily.   vitamin B-12 (CYANOCOBALAMIN) 1000 MCG tablet Take 2,500 mcg by mouth daily.   VITAMIN D PO Take 1 tablet by mouth daily.   Wheat Dextrin (BENEFIBER PO) Take 1 tablet by mouth daily.   [DISCONTINUED] amLODipine  (NORVASC ) 10 MG tablet Take 10 mg by mouth daily.     Allergies:   Requip  [ropinirole  hcl] and Morphine and codeine   Social History   Socioeconomic History   Marital status: Divorced    Spouse name: Not on file   Number of children: 4   Years of education: Not on file   Highest education level: Not on file  Occupational History   Occupation: retired  Tobacco Use   Smoking status: Never   Smokeless tobacco: Never  Vaping Use   Vaping status: Never Used  Substance and Sexual Activity   Alcohol use: No   Drug use: No   Sexual activity: Not on file  Other Topics Concern   Not on file  Social History Narrative   ** Merged History Encounter **       Social Drivers of Corporate investment banker Strain: Not on file  Food Insecurity: Not on file  Transportation Needs: Not on file  Physical Activity: Not on file  Stress: Not on file  Social Connections: Not on file     Family History: The patient's family history includes Breast cancer in her maternal grandmother; Liver cancer in her sister; Lung cancer in her sister; Pancreatic cancer in her sister. There is no history of Colon cancer, Esophageal cancer, or Stomach cancer. ROS:   Please see the history of present illness.    All 14 point review of systems negative except as described per history of present illness.  EKGs/Labs/Other Studies Reviewed:    The following studies were reviewed today:   EKG:       Recent Labs: 06/30/2023: ALT 13; BUN 27; Creatinine, Ser 1.06; Hemoglobin 12.7; Platelets 225.0;  Potassium 3.9; Sodium 133; TSH 0.40  Recent Lipid Panel No results found for: CHOL, TRIG, HDL, CHOLHDL, VLDL, LDLCALC, LDLDIRECT  Physical Exam:    VS:  BP 118/62   Pulse 80   Ht 5' 9 (1.753 m)   Wt 166 lb 9.6 oz (75.6 kg)   SpO2 99%   BMI 24.60 kg/m     Wt Readings from Last 3 Encounters:  12/30/23 166 lb 9.6 oz (75.6 kg)  10/11/23 169 lb 9.6 oz (76.9 kg)  09/26/23 170 lb (77.1 kg)     GENERAL:  Well nourished, well developed in no acute distress NECK: No JVD; No carotid bruits CARDIAC: Irregularly irregular, S1 and S2 present, cannot appreciate any prominent murmur  CHEST:  Clear to auscultation without rales, wheezing or rhonchi  Extremities: Trace bilateral ankle edema. Pulses bilaterally symmetric with radial 2+ and dorsalis pedis 2+ NEUROLOGIC:  Alert and oriented x 3  Medication Adjustments/Labs and Tests Ordered: Current medicines are reviewed at length with the patient today.  Concerns regarding medicines are outlined above.  No orders of the defined types were placed in this encounter.  Meds ordered this encounter  Medications   amLODipine  (NORVASC ) 5 MG tablet    Sig: Take 1 tablet (5 mg total) by mouth daily.    Dispense:  90 tablet    Refill:  3    Signed, Damonta Cossey reddy Naveah Brave, MD, MPH, [facility]. 12/30/2023 9:13 AM    [facility]

## 2024-01-04 ENCOUNTER — Encounter: Payer: Self-pay | Admitting: Gastroenterology

## 2024-01-11 ENCOUNTER — Ambulatory Visit
